# Patient Record
Sex: Male | Born: 2000
Health system: Southern US, Community
[De-identification: ages and names within clinical notes are randomized; demographics above are authoritative.]

## PROBLEM LIST (undated history)

## (undated) DIAGNOSIS — J45909 Unspecified asthma, uncomplicated: Secondary | ICD-10-CM

## (undated) HISTORY — PX: TESTICLE TORSION REDUCTION: SHX795

## (undated) HISTORY — DX: Unspecified asthma, uncomplicated: J45.909

---
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
## 2019-01-03 DIAGNOSIS — F332 Major depressive disorder, recurrent severe without psychotic features: Secondary | ICD-10-CM | POA: Diagnosis not present

## 2019-02-03 MED FILL — clonazePAM 1 MG TABS: 1 | 30 days supply | Qty: 30 | Fill #0

## 2019-02-03 MED FILL — VENLAFAXINE HCL ER 37.5 MG: 37.5 | 30 days supply | Qty: 30 | Fill #0

## 2019-03-31 ENCOUNTER — Other Ambulatory Visit: Payer: Self-pay

## 2019-03-31 ENCOUNTER — Ambulatory Visit: Payer: 59 | Admitting: Psychiatry

## 2019-03-31 ENCOUNTER — Encounter: Payer: Self-pay | Admitting: Psychiatry

## 2019-03-31 VITALS — Ht 69.0 in | Wt 223.0 lb

## 2019-03-31 DIAGNOSIS — F341 Dysthymic disorder: Secondary | ICD-10-CM

## 2019-03-31 DIAGNOSIS — F411 Generalized anxiety disorder: Secondary | ICD-10-CM

## 2019-03-31 DIAGNOSIS — F422 Mixed obsessional thoughts and acts: Secondary | ICD-10-CM | POA: Diagnosis not present

## 2019-03-31 MED ORDER — FLUVOXAMINE MALEATE 100 MG PO TABS: 100.0000 mg | ORAL_TABLET | Freq: Every day | ORAL | 2 refills | Status: DC

## 2019-03-31 MED FILL — FLUVOXAMINE MALEATE 100 MG: 100 | 30 days supply | Qty: 30 | Fill #0
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
## 2019-04-13 DIAGNOSIS — H5213 Myopia, bilateral: Secondary | ICD-10-CM | POA: Diagnosis not present

## 2019-06-03 ENCOUNTER — Ambulatory Visit: Payer: 59 | Admitting: Psychiatry

## 2019-06-03 ENCOUNTER — Other Ambulatory Visit: Payer: Self-pay

## 2019-06-03 ENCOUNTER — Ambulatory Visit (INDEPENDENT_AMBULATORY_CARE_PROVIDER_SITE_OTHER): Payer: 59 | Admitting: Psychiatry

## 2019-06-03 ENCOUNTER — Encounter: Payer: Self-pay | Admitting: Psychiatry

## 2019-06-03 VITALS — Ht 69.0 in | Wt 217.0 lb

## 2019-06-03 DIAGNOSIS — F341 Dysthymic disorder: Secondary | ICD-10-CM

## 2019-06-03 DIAGNOSIS — F411 Generalized anxiety disorder: Secondary | ICD-10-CM | POA: Diagnosis not present

## 2019-06-03 DIAGNOSIS — F422 Mixed obsessional thoughts and acts: Secondary | ICD-10-CM
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
## 2019-08-31 DIAGNOSIS — J310 Chronic rhinitis: Secondary | ICD-10-CM | POA: Diagnosis not present

## 2019-08-31 DIAGNOSIS — R21 Rash and other nonspecific skin eruption: Secondary | ICD-10-CM | POA: Diagnosis not present

## 2019-08-31 DIAGNOSIS — J4530 Mild persistent asthma, uncomplicated: Secondary | ICD-10-CM | POA: Diagnosis not present

## 2019-08-31 MED FILL — QVAR REDIHALER 80 MCG/ACT A: 80 | 30 days supply | Qty: 11 | Fill #0

## 2019-08-31 MED FILL — FLUTICASONE PROP 50 MCG SPR: 50 | 60 days supply | Qty: 16 | Fill #0

## 2019-08-31 MED FILL — ALBUTEROL SULFATE HFA 108 (: 108 (90 BAS | 25 days supply | Qty: 18 | Fill #0

## 2019-09-29 ENCOUNTER — Other Ambulatory Visit: Payer: Self-pay

## 2019-09-29 ENCOUNTER — Ambulatory Visit (INDEPENDENT_AMBULATORY_CARE_PROVIDER_SITE_OTHER): Payer: 59 | Admitting: Adult Health

## 2019-09-29 ENCOUNTER — Encounter: Payer: Self-pay | Admitting: Adult Health

## 2019-09-29 VITALS — BP 138/76 | HR 95 | Temp 99.4°F | Ht 70.25 in | Wt 208.6 lb

## 2019-09-29 DIAGNOSIS — Z114 Encounter for screening for human immunodeficiency virus [HIV]: Secondary | ICD-10-CM

## 2019-09-29 DIAGNOSIS — Z7253 High risk bisexual behavior: Secondary | ICD-10-CM | POA: Diagnosis not present

## 2019-09-29 DIAGNOSIS — Z23 Encounter for immunization: Secondary | ICD-10-CM | POA: Diagnosis not present

## 2019-09-29 DIAGNOSIS — Z0000 Encounter for general adult medical examination without abnormal findings: Secondary | ICD-10-CM | POA: Diagnosis not present

## 2019-09-29 DIAGNOSIS — F341 Dysthymic disorder: Secondary | ICD-10-CM

## 2019-09-29 DIAGNOSIS — F411 Generalized anxiety disorder: Secondary | ICD-10-CM

## 2019-09-29 NOTE — Progress Notes (Signed)
Subjective:    Patient ID: David Sweeney, male    DOB: [birth year], 18 y.o.   MRN: [id number]  HPI:  David Sweeney is here to establish as a new pt. He is a pleasant 18 year old male. PMH: Asthma, Musculoskeletal pain, GAD, Mixed obsessional thoughts/acts, MDD- followed by Psychiatry/Psychology  Previously on Venlafaxine and Clonazepam- not currently on any psychotropics  He reports thoughts of harming himself last week, however denies suicidal plans and made verbal contract to seek immediate medical care if he has any serious suicidal ideations. He lives at home with parents and two younger siblings. He reports mother and sister both have mental health disorders- GAD, depression. He is concerned about Bipolar D/O, encouraged to discuss with Psychiatry. He reports depression/GAD >3 years- has [facility] care team- psychiatry and psychology-  Last OV with Psychiatry 06/03/2019, f/u PRN Last OV with Psychology 04/13/2019, however cannot read note- system indicates "Encounter for Claims". He denies tobacco/vape/ETOH/ilicit drug use. He lost his job in March due to pandemic lay offs. He has not been in school for [employer] at [employer]. He plans on resuming studies Jan 2021. He reports sleeping 5-7 hrs/night, drinking 2-4 cups coffee/day. He reports intermittent R hip and lumbar back pain- chronic in nature but feels that both have worsened >8 months since he has been more sedentary with loss of job/school. He previously enjoyed yoga and jogging- has not engaged in either activity >6 months.  Depression screen PHQ 2/9 09/29/2019  Decreased Interest 2  Down, Depressed, Hopeless 1  PHQ - 2 Score 3  Altered sleeping 3  Tired, decreased energy 1  Change in appetite 2  Feeling bad or failure about yourself  3  Trouble concentrating 0  Moving slowly or fidgety/restless 3  Suicidal thoughts 1  PHQ-9 Score 16  Difficult doing work/chores Somewhat difficult   Patient Care Team     Relationship Specialty Notifications Start End  Patient, No Pcp Per PCP - General General Practice  09/18/18     Patient Active Problem List   Diagnosis Date Noted  . Healthcare maintenance 09/29/2019  . Persistent depressive disorder with mixed features, currently mild 09/18/2018  . GAD (generalized anxiety disorder) 08/31/2018  . Mixed obsessional thoughts and acts 08/31/2018  . Episodic tension-type headache, not intractable 09/04/2017     Past Medical History:  Diagnosis Date  . Asthma      Past Surgical History:  Procedure Laterality Date  . TESTICLE TORSION REDUCTION       Family History  Problem Relation Age of Onset  . Hypertension Father   . Depression Sister   . Cancer Maternal Grandmother        colon, appendix  . Depression Maternal Grandmother   . Hypertension Maternal Grandmother   . Diabetes Paternal Grandfather   . Depression Maternal Aunt   . Alcohol abuse Paternal Aunt   . Depression Paternal Aunt      Social History   Substance and Sexual Activity  Drug Use Never     Social History   Substance and Sexual Activity  Alcohol Use Not Currently     Social History   Tobacco Use  Smoking Status Never Smoker  Smokeless Tobacco Never Used     Outpatient Encounter Medications as of 09/29/2019  Medication Sig  . fluticasone (FLONASE) 50 MCG/ACT nasal spray Place 1 spray into both nostrils daily.  Marland Kitchen QVAR REDIHALER 40 MCG/ACT inhaler   . [DISCONTINUED] amoxicillin (AMOXIL) 875 MG tablet Take 1  tablet (875 mg total) by mouth 2 (two) times daily.  . [DISCONTINUED] montelukast (SINGULAIR) 10 MG tablet Take 10 mg daily by mouth.   No facility-administered encounter medications on file as of 09/29/2019.     Allergies: Patient has no known allergies.  Body mass index is 29.72 kg/m.  Blood pressure 138/76, pulse 95, temperature 99.4 F (37.4 C), temperature source Oral, height 5' 10.25" (1.784 m), weight 208 lb 9.6 oz (94.6 kg), SpO2 100  %.  Review of Systems  Constitutional: Positive for fatigue. Negative for activity change, appetite change, chills, diaphoresis, fever and unexpected weight change.  Eyes: Negative for visual disturbance.  Respiratory: Negative for cough, chest tightness, shortness of breath, wheezing and stridor.   Cardiovascular: Negative for chest pain, palpitations and leg swelling.  Neurological: Negative for dizziness and headaches.  Hematological: Negative for adenopathy. Does not bruise/bleed easily.  Psychiatric/Behavioral: Positive for dysphoric mood. Negative for agitation, behavioral problems, confusion, decreased concentration, hallucinations, self-injury, sleep disturbance and suicidal ideas. The patient is nervous/anxious. The patient is not hyperactive.        Objective:   Physical Exam Constitutional:      General: He is not in acute distress.    Appearance: Normal appearance. He is obese. He is not ill-appearing, toxic-appearing or diaphoretic.  HENT:     Head: Normocephalic and atraumatic.  Eyes:     Extraocular Movements: Extraocular movements intact.     Conjunctiva/sclera: Conjunctivae normal.     Pupils: Pupils are equal, round, and reactive to light.  Cardiovascular:     Rate and Rhythm: Normal rate and regular rhythm.     Pulses: Normal pulses.     Heart sounds: Normal heart sounds. No murmur. No friction rub. No gallop.   Skin:    General: Skin is warm and dry.     Capillary Refill: Capillary refill takes less than 2 seconds.  Neurological:     Mental Status: He is alert and oriented to person, place, and time.  Psychiatric:        Attention and Perception: Attention and perception normal.        Mood and Affect: Mood is anxious.        Speech: Speech normal.        Behavior: Behavior normal.        Thought Content: Thought content normal.        Cognition and Memory: Cognition normal.        Judgment: Judgment normal.     Comments: Pt was reluctant to make/hold eye  contact        Assessment & Plan:   1. Screening for HIV (human immunodeficiency virus)   2. High risk bisexual behavior   3. Need for influenza vaccination   4. Healthcare maintenance   5. GAD (generalized anxiety disorder)   6. Persistent depressive disorder with mixed features, currently mild     Healthcare maintenance 1) Please call your psychiatrist and psychologist to schedule appt's in the next 1-2 weeks. If you have serious thoughts of harming yourself/others- seek immediate medical care. 2) Increase daily exercise- recommend yoga , walking . 3) if back pain does not improve in 6 weeks, please call clinic- will refer to Physical Therapy. 4) Try to eat at least three meals/day, continue with your excellent water intake. 5) Continue with your Pulmonologist as directed. 6) Follow-up 2 months for complete physical, we will contact you with your lab results. 7) Continue to social distance and wear a mask when  in public.  GAD (generalized anxiety disorder) Please call your psychiatrist and psychologist to schedule appt's in the next 1-2 weeks. If you have serious thoughts of harming yourself/others- seek immediate medical care. Pt verbalized understanding/agreement  Persistent depressive disorder with mixed features, currently mild Depression screen PHQ 2/9 09/29/2019  Decreased Interest 2  Down, Depressed, Hopeless 1  PHQ - 2 Score 3  Altered sleeping 3  Tired, decreased energy 1  Change in appetite 2  Feeling bad or failure about yourself  3  Trouble concentrating 0  Moving slowly or fidgety/restless 3  Suicidal thoughts 1  PHQ-9 Score 16  Difficult doing work/chores Somewhat difficult  Please call your psychiatrist and psychologist to schedule appt's in the next 1-2 weeks. If you have serious thoughts of harming yourself/others- seek immediate medical care. Pt verbalized understanding/agreement    FOLLOW-UP:  Return in about 2 months (around 11/30/2019)  for CPE.

## 2019-09-29 NOTE — Assessment & Plan Note (Signed)
1) Please call your psychiatrist and psychologist to schedule appt's in the next 1-2 weeks. If you have serious thoughts of harming yourself/others- seek immediate medical care. 2) Increase daily exercise- recommend yoga 38mins, walking 96mins. 3) if back pain does not improve in 6 weeks, please call clinic- will refer to Physical Therapy. 4) Try to eat at least three meals/day, continue with your excellent water intake. 5) Continue with your Pulmonologist as directed. 6) Follow-up 2 months for complete physical, we will contact you with your lab results. 7) Continue to social distance and wear a mask when in public.

## 2019-09-29 NOTE — Patient Instructions (Addendum)
Chronic Back Pain When back pain lasts longer than 3 months, it is called chronic back pain.The cause of your back pain may not be known. Some common causes include:  Wear and tear (degenerative disease) of the bones, ligaments, or disks in your back.  Inflammation and stiffness in your back (arthritis). People who have chronic back pain often go through certain periods in which the pain is more intense (flare-ups). Many people can learn to manage the pain with home care. Follow these instructions at home: Pay attention to any changes in your symptoms. Take these actions to help with your pain: Activity   Avoid bending and other activities that make the problem worse.  Maintain a proper position when standing or sitting: ? When standing, keep your upper back and neck straight, with your shoulders pulled back. Avoid slouching. ? When sitting, keep your back straight and relax your shoulders. Do not round your shoulders or pull them backward.  Do not sit or stand in one place for long periods of time.  Take brief periods of rest throughout the day. This will reduce your pain. Resting in a lying or standing position is usually better than sitting to rest.  When you are resting for longer periods, mix in some mild activity or stretching between periods of rest. This will help to prevent stiffness and pain.  Get regular exercise. Ask your health care provider what activities are safe for you.  Do not lift anything that is heavier than 10 lb (4.5 kg). Always use proper lifting technique, which includes: ? Bending your knees. ? Keeping the load close to your body. ? Avoiding twisting.  Sleep on a firm mattress in a comfortable position. Try lying on your side with your knees slightly bent. If you lie on your back, put a pillow under your knees. Managing pain  If directed, apply ice to the painful area. Your health care provider may recommend applying ice during the first 24-48 hours after  a flare-up begins. ? Put ice in a plastic bag. ? Place a towel between your skin and the bag. ? Leave the ice on for 20 minutes, 2-3 times per day.  If directed, apply heat to the affected area as often as told by your health care provider. Use the heat source that your health care provider recommends, such as a moist heat pack or a heating pad. ? Place a towel between your skin and the heat source. ? Leave the heat on for 20-30 minutes. ? Remove the heat if your skin turns bright red. This is especially important if you are unable to feel pain, heat, or cold. You may have a greater risk of getting burned.  Try soaking in a warm tub.  Take over-the-counter and prescription medicines only as told by your health care provider.  Keep all follow-up visits as told by your health care provider. This is important. Contact a health care provider if:  You have pain that is not relieved with rest or medicine. Get help right away if:  You have weakness or numbness in one or both of your legs or feet.  You have trouble controlling your bladder or your bowels.  You have nausea or vomiting.  You have pain in your abdomen.  You have shortness of breath or you faint. This information is not intended to replace advice given to you by your health care provider. Make sure you discuss any questions you have with your health care provider. Document Released: 11/22/2004   Document Revised: 02/05/2019 Document Reviewed: 04/24/2017 Elsevier Patient Education  [place].  1) Please call your psychiatrist and psychologist to schedule appt's in the next 1-2 weeks. If you have serious thoughts of harming yourself/others- seek immediate medical care. 2) Increase daily exercise- recommend yoga 88mins, walking 36mins. 3) if back pain does not improve in 6 weeks, please call clinic- will refer to Physical Therapy. 4) Try to eat at least three meals/day, continue with your excellent water intake. 5)  Continue with your Pulmonologist as directed. 6) Follow-up 2 months for complete physical, we will contact you with your lab results. 7) Continue to social distance and wear a mask when in public. WELCOME TO THE PRACTICE!

## 2019-09-29 NOTE — Assessment & Plan Note (Addendum)
Depression screen PHQ 2/9 09/29/2019  Decreased Interest 2  Down, Depressed, Hopeless 1  PHQ - 2 Score 3  Altered sleeping 3  Tired, decreased energy 1  Change in appetite 2  Feeling bad or failure about yourself  3  Trouble concentrating 0  Moving slowly or fidgety/restless 3  Suicidal thoughts 1  PHQ-9 Score 16  Difficult doing work/chores Somewhat difficult  Please call your psychiatrist and psychologist to schedule appt's in the next 1-2 weeks. If you have serious thoughts of harming yourself/others- seek immediate medical care. Pt verbalized understanding/agreement

## 2019-09-29 NOTE — Assessment & Plan Note (Addendum)
Please call your psychiatrist and psychologist to schedule appt's in the next 1-2 weeks. If you have serious thoughts of harming yourself/others- seek immediate medical care. Pt verbalized understanding/agreement

## 2019-09-30 LAB — HIV ANTIBODY (ROUTINE TESTING W REFLEX): HIV Screen 4th Generation wRfx: NONREACTIVE

## 2019-10-13 ENCOUNTER — Encounter: Payer: Self-pay | Admitting: Adult Health

## 2019-10-13 ENCOUNTER — Other Ambulatory Visit: Payer: Self-pay

## 2019-10-13 ENCOUNTER — Ambulatory Visit (INDEPENDENT_AMBULATORY_CARE_PROVIDER_SITE_OTHER): Payer: 59 | Admitting: Adult Health

## 2019-10-13 VITALS — BP 116/69 | HR 73 | Temp 99.3°F | Ht 70.25 in | Wt 212.1 lb

## 2019-10-13 DIAGNOSIS — G8929 Other chronic pain: Secondary | ICD-10-CM | POA: Diagnosis not present

## 2019-10-13 DIAGNOSIS — F411 Generalized anxiety disorder: Secondary | ICD-10-CM

## 2019-10-13 DIAGNOSIS — F341 Dysthymic disorder: Secondary | ICD-10-CM

## 2019-10-13 DIAGNOSIS — M25551 Pain in right hip: Secondary | ICD-10-CM | POA: Diagnosis not present

## 2019-10-13 DIAGNOSIS — Z0000 Encounter for general adult medical examination without abnormal findings: Secondary | ICD-10-CM

## 2019-10-13 NOTE — Assessment & Plan Note (Signed)
Pain steadily worsening since 2016. Referral to Orthopedic Specialist placed.
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
## 2019-10-13 NOTE — Patient Instructions (Addendum)

## 2019-10-13 NOTE — Assessment & Plan Note (Signed)
Not currently on RX Advised to f/u with his mental health care team

## 2019-10-13 NOTE — Assessment & Plan Note (Signed)
Remain well hydrated, follow heart healthy diet. Do not perform high impact activities until evaluated by Orthopedic Specialist- referral placed, re: Chronic right hip pain. Recommend that you follow-up with your Mental Health care team. Continue to social distance and wear a mask when in public. Continue with your pulmonologist as directed. Recommend annual physical.

## 2019-10-13 NOTE — Assessment & Plan Note (Signed)
Mood stable, he reports feeling "less depressed" currently. He denies SI/HI

## 2019-10-14 ENCOUNTER — Ambulatory Visit: Payer: 59 | Admitting: Orthopaedic Surgery

## 2019-10-14 ENCOUNTER — Ambulatory Visit (INDEPENDENT_AMBULATORY_CARE_PROVIDER_SITE_OTHER): Payer: 59

## 2019-10-14 ENCOUNTER — Encounter: Payer: Self-pay | Admitting: Orthopaedic Surgery

## 2019-10-14 DIAGNOSIS — M25551 Pain in right hip: Secondary | ICD-10-CM

## 2019-10-14 DIAGNOSIS — M545 Low back pain, unspecified: Secondary | ICD-10-CM

## 2019-10-14 NOTE — Progress Notes (Signed)
Office Visit Note   Patient: David Sweeney           Date of Birth: [birth year]           MRN: [id number] Visit Date: 10/14/2019              Requested by: Julaine Fusi, NP [address] [place],  [place] [address] PCP: Julaine Fusi, NP   Assessment & Plan: Visit Diagnoses:  1. Pain in right hip   2. Right-sided low back pain without sciatica, unspecified chronicity     Plan: My impression is low back pain and right hip pain. His symptoms are more related to the back therefore I recommended over-the-counter NSAIDs and outpatient physical therapy.  Follow-Up Instructions: Return if symptoms worsen or fail to improve.   Orders:  Orders Placed This Encounter  Procedures  . XR HIP UNILAT W OR W/O PELVIS 2-3 VIEWS RIGHT  . XR Lumbar Spine 2-3 Views  . Ambulatory referral to Physical Therapy   No orders of the defined types were placed in this encounter.     Procedures: No procedures performed   Clinical Data: No additional findings.   Subjective: Chief Complaint  Patient presents with  . Right Hip - Pain    Patient is a 18 year old comes in for evaluation of right hip and low back pain for years per the patient.  Denies any injuries.  He states that he feels a popping in his hip and he has low back pain.  Denies any radicular symptoms.  Denies any groin pain.  The pain is worse with walking and ambulation.   Review of Systems  Constitutional: Negative.   All other systems reviewed and are negative.    Objective: Vital Signs: There were no vitals taken for this visit.  Physical Exam Vitals and nursing note reviewed.  Constitutional:      Appearance: He is well-developed.  HENT:     Head: Normocephalic and atraumatic.  Eyes:     Pupils: Pupils are equal, round, and reactive to light.  Pulmonary:     Effort: Pulmonary effort is normal.  Abdominal:     Palpations: Abdomen is soft.  Musculoskeletal:        General: Normal range of motion.   Cervical back: Neck supple.  Skin:    General: Skin is warm.  Neurological:     Mental Status: He is alert and oriented to person, place, and time.  Psychiatric:        Behavior: Behavior normal.        Thought Content: Thought content normal.        Judgment: Judgment normal.     Ortho Exam Right hip exam shows full range of motion that is painless.  No sciatic tension signs.  Negative Faber.  Negative FADIR. Low back exam is tender to palpation along the spinous processes and along the right side of the lumbar musculature. Specialty Comments:  No specialty comments available.  Imaging: XR HIP UNILAT W OR W/O PELVIS 2-3 VIEWS RIGHT  Result Date: 10/14/2019 No acute or structural abnormalities  XR Lumbar Spine 2-3 Views  Result Date: 10/14/2019 No acute or structural abnormalities.    PMFS History: Patient Active Problem List   Diagnosis Date Noted  . Chronic hip pain, right 10/13/2019  . Healthcare maintenance 09/29/2019  . Persistent depressive disorder with mixed features, currently mild 09/18/2018  . GAD (generalized anxiety disorder) 08/31/2018  . Mixed obsessional thoughts and acts 08/31/2018  .  Episodic tension-type headache, not intractable 09/04/2017   Past Medical History:  Diagnosis Date  . Asthma     Family History  Problem Relation Age of Onset  . Hypertension Father   . Depression Sister   . Cancer Maternal Grandmother        colon, appendix  . Depression Maternal Grandmother   . Hypertension Maternal Grandmother   . Diabetes Paternal Grandfather   . Depression Maternal Aunt   . Alcohol abuse Paternal Aunt   . Depression Paternal Aunt     Past Surgical History:  Procedure Laterality Date  . TESTICLE TORSION REDUCTION     Social History   Occupational History  . Not on file  Tobacco Use  . Smoking status: Never Smoker  . Smokeless tobacco: Never Used  Substance and Sexual Activity  . Alcohol use: Not Currently  . Drug use: Never  .  Sexual activity: Not Currently

## 2019-10-21 MED FILL — QVAR REDIHALER 80 MCG/ACT A: 80 | 30 days supply | Qty: 11 | Fill #1

## 2019-11-10 MED FILL — AMOXICILLIN 500 MG CAPSULE: 500 | 1 days supply | Qty: 4 | Fill #0

## 2019-11-16 ENCOUNTER — Ambulatory Visit: Payer: 59 | Attending: Internal Medicine

## 2019-11-16 DIAGNOSIS — Z20822 Contact with and (suspected) exposure to covid-19: Secondary | ICD-10-CM | POA: Diagnosis not present

## 2019-11-17 LAB — NOVEL CORONAVIRUS, NAA: SARS-CoV-2, NAA: NOT DETECTED

## 2019-11-18 ENCOUNTER — Ambulatory Visit: Payer: 59 | Admitting: Physical Therapy

## 2019-11-19 MED FILL — AMOXICILLIN 500 MG CAPSULE: 500 | 1 days supply | Qty: 4 | Fill #0

## 2019-12-03 ENCOUNTER — Ambulatory Visit (INDEPENDENT_AMBULATORY_CARE_PROVIDER_SITE_OTHER): Payer: 59 | Admitting: Psychiatry

## 2019-12-03 ENCOUNTER — Encounter: Payer: Self-pay | Admitting: Psychiatry

## 2019-12-03 ENCOUNTER — Other Ambulatory Visit: Payer: Self-pay

## 2019-12-03 VITALS — Ht 70.0 in | Wt 212.0 lb

## 2019-12-03 DIAGNOSIS — F341 Dysthymic disorder: Secondary | ICD-10-CM | POA: Diagnosis not present

## 2019-12-03 DIAGNOSIS — F422 Mixed obsessional thoughts and acts: Secondary | ICD-10-CM | POA: Diagnosis not present

## 2019-12-03 DIAGNOSIS — F411 Generalized anxiety disorder: Secondary | ICD-10-CM | POA: Diagnosis not present

## 2019-12-03 MED ORDER — CLOMIPRAMINE HCL 75 MG PO CAPS: 75.0000 mg | ORAL_CAPSULE | Freq: Every day | ORAL | 2 refills | Status: DC

## 2019-12-03 MED FILL — clomiPRAMINE HCL 75 MG CAPS: 75 | 30 days supply | Qty: 30 | Fill #0

## 2019-12-03 NOTE — Progress Notes (Signed)
[facility]  Patient ID: David Sweeney,  MRN: [id number]  PCP: Esaw Grandchild, NP  Date of Evaluation: 12/03/2019 Time spent:20 minutes from 1600 to 1620 Chief Complaint:  Chief Complaint    Anxiety; Stress; Depression      HISTORY/CURRENT STATUS: Q is seen onsite in office 20 minutes face-to-face individually with consent with epic collateral for psychiatric interview and exam in 74-month evaluation and management of marginal insight OCD, generalized anxiety, and dysthymia with mixed features though cluster A character defenses render therapeutic change difficult to secure. However after last appointment in which patient had continued dissatisfaction with medications including Luvox and Klonopin following Effexor, Lexapro and Zoloft, the patient preferred to terminate treatment unless needed later and considered seeing psychologist for therapy or further opinion.  He returns stating he cannot get an appointment at [facility] psychology as he planned last session specifically with Dr. Gaynell Face.  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  His current complaints are guilt about taking advantage of parents supporting him, anger, anxiety, insomnia, and fixation in these symptom postures as though unable to help himself or be helped by others.  He required speech therapy and occupational therapy for hand strength and coordination in elementary and preschool years.  Anxiety has been progressive since middle school having IEP determining need for treatment then delayed by patient.  Procrastination and underachievement continued through high school having anxiety similar to mother and sister. Mother did poorly on Lexapro or Zoloft but is doing best on Cymbalta for panic and  hypnagogic hallucinations. Younger brother did poorly on methylphenidate for ADHD. Sisterr is doing well on Lexapro.  The patient seeks treatment now seemingly as though he must improve before he will further pursue his previous plans to request testing such as for bipolar disorder with no previous significant concern for autism or psychotic disorder.  He has no mania, suicidality, psychosis, or delirium currently.   Depression        The patient presents withhistory of depression as a chronicproblem starting more than 2 year ago. The onset quality was gradual. The problem occurred every several days.The problem has been waxing and waning.Associated symptoms include decreased concentration,fatigue, anger, irritability, anxious/nervous behavior, sense of worry, doubt projecting failure, compulsive rituals such as handwashing, obsessional thoughts,ruminative guilt, episodic insomnia, variable dysphoric dissatisfaction, and helplessness. Associated symptoms include no hopelessness,no decreased interest,no headaches,no perceptions,and no suicidal ideas.The symptoms are aggravated by work stress, social issues and family issues.Past treatments include SNRIs - Serotonin and norepinephrine reuptake inhibitors, SSRIs - Selective serotonin reuptake inhibitors and other medications.Compliance with treatment is variable.Past compliance problems include difficulty with treatment plan and medication issues.Previous treatment provided moderaterelief.Risk factors include a change in medication usage/dosage, family history, family history of mental illness, history of mental illness, major life event, prior traumatic experience, and stress. Past medical history includes anxiety,depression,mental health disorderand obsessive-compulsive disorder. Pertinent negatives include no chronic illness,no recent illness,no life-threatening condition,no physical disability,no bipolar  disorder,no eating disorder,no post-traumatic stress disorder,no schizophrenia,no suicide attemptsand no head trauma.  Individual Medical History/ Review of Systems: Changes? :Yes Weight was 153 pounds when initially seen in 10th grade becoming an adult weight of 225 pounds currently reduced over the last 56months to 212 pounds.  Low back and right hip pain has  been seen by orthopedist, he has not attended pulmonology for asthma, he has been tested for STD, and he has a history of testicular torsion surgery.  Allergies: Patient has no known allergies.  Current Medications:  Current Outpatient Medications:  .  clomiPRAMINE (ANAFRANIL) 75 MG capsule, Take 1 capsule (75 mg total) by mouth at bedtime., Disp: 30 capsule, Rfl: 2 .  fluticasone (FLONASE) 50 MCG/ACT nasal spray, Place 1 spray into both nostrils daily., Disp: , Rfl:  .  QVAR REDIHALER 40 MCG/ACT inhaler, , Disp: , Rfl:    Medication Side Effects: none  Family Medical/ Social History: Changes? No as sister and mother are currently doing well in treatment but patient has regressed after improvement completing high school and working and starting study at [employer] in a [employer] apprenticeship program from which he was terminated for unknown reasons.  MENTAL HEALTH EXAM:  Height 5\' 10"  (1.778 m), weight 212 lb (96.2 kg).Body mass index is 30.42 kg/m. Muscle strengths and tone 5/5, postural reflexes and gait 0/0, and AIMS = 0 otherwise deferred for coronavirus shutdown  General Appearance: Casual, Fairly Groomed, Guarded, Meticulous and Obese  Eye Contact:  Fair to limited  Speech:  Blocked, Clear and Coherent, Garbled and Slow  Volume:  Decreased  Mood:  Anxious, Depressed, Dysphoric, Irritable and Worthless  Affect:  Depressed, Inappropriate, Restricted and Anxious  Thought Process:  Coherent, Irrelevant and Descriptions of Associations: Tangential and Circumstantial  Orientation:  Full (Time, Place, and Person)  Thought Content:  Obsessions, Paranoid Ideation, Rumination and Tangential   Suicidal Thoughts:  No but passive ideation endorsed on PHQ-9 on 09/29/2019  Homicidal Thoughts:  No  Memory:  Immediate;   Good Remote;   Fair  Judgement:  Impaired  Insight:  Fair and Lacking  Psychomotor Activity:  Decreased and Mannerisms  Concentration:  Concentration: Fair and Attention Span: Good  Recall:  Good  Fund of Knowledge: Good  Language: Fair  Assets:  Leisure Time Resilience Talents/Skills  ADL's:  Intact  Cognition: WNL  Prognosis:  Fair    DIAGNOSES:    ICD-10-CM   1. Mixed obsessional thoughts and acts  F42.2 clomiPRAMINE (ANAFRANIL) 75 MG capsule  2. Persistent depressive disorder with mixed features, currently mild  F34.1 clomiPRAMINE (ANAFRANIL) 75 MG capsule  3. GAD (generalized anxiety disorder)  F41.1 clomiPRAMINE (ANAFRANIL) 75 MG capsule  Screening: Depression screen [facility] 2/9 09/29/2019  Decreased Interest 2  Down, Depressed, Hopeless 1  PHQ - 2 Score 3  Altered sleeping 3  Tired, decreased energy 1  Change in appetite 2  Feeling bad or failure about yourself  3  Trouble concentrating 0  Moving slowly or fidgety/restless 3  Suicidal thoughts 1  PHQ-9 Score 16  Difficult doing work/chores Somewhat difficult  By 14/10/2018, NP at sexual health appointment regarding high risk bisexual behavior   Receiving Psychotherapy: No    RECOMMENDATIONS: Clinical course of previous care has been most successful by treating anxiety foremost except psychotherapy has not been successful interpersonally.  Through the course of middle school onward educational behavioral interventions, patient has avoided treatment predominantly after initial success in preschool and elementary.  He requests reevaluation without being specific having a stent whether he had bipolar disorder in earlier appointments this year.  OCD remains clinically most primary with generalized anxiety and dysthymia the other treatment  targets.  Treatment is likely to require antiobsessional antianxiety antidepressant medication likely augmented by low-dose neuroleptic.  He does not at this time agree to more frequent  follow-up sessions than 3 months such that we consolidate initial treatment to Anafranil and therapy urging him to return sooner for follow-up if willing to be more rigorous with medication interventions.  He is E scribed Anafranil 75 mg every bedtime sent as #30 with 2 refills to [facility] outpatient pharmacy for OCD, GAD, dysthymia with mixed features.  He may phone for medication dosing adjustment or augmentation with Trilafon in the interim not returning sooner.  Psychotherapy is recommended with Rollen Sox, LPC at [facility] as he seeks alternative for his lack of appointing to [facility].  Prevention and monitoring and safety hygiene are addressed with crisis plans if needed, mother not attending today as though requiring patient to become more mature and committed in his treatment, though mother has monitored treatment needs in the past.  Chauncey Mann, MD

## 2019-12-07 ENCOUNTER — Encounter: Payer: Self-pay | Admitting: Physical Therapy

## 2019-12-07 ENCOUNTER — Other Ambulatory Visit: Payer: Self-pay

## 2019-12-07 ENCOUNTER — Ambulatory Visit (INDEPENDENT_AMBULATORY_CARE_PROVIDER_SITE_OTHER): Payer: 59 | Admitting: Physical Therapy

## 2019-12-07 DIAGNOSIS — M25551 Pain in right hip: Secondary | ICD-10-CM | POA: Diagnosis not present

## 2019-12-07 DIAGNOSIS — G8929 Other chronic pain: Secondary | ICD-10-CM

## 2019-12-07 DIAGNOSIS — M545 Low back pain: Secondary | ICD-10-CM | POA: Diagnosis not present

## 2019-12-07 MED FILL — QVAR REDIHALER 80 MCG/ACT A: 80 | 30 days supply | Qty: 11 | Fill #2

## 2019-12-07 NOTE — Patient Instructions (Signed)
Access Code: [email or web address]  URL: [email or web address]  Date: 12/07/2019  Prepared by: Ivery Quale   Exercises  Standing Lumbar Extension - 10 reps - 1-2 sets - 5 hold - 2x daily - 6x weekly  Standing Quadratus Lumborum Stretch with Doorway - 2-3 reps - 1 sets - 30 hold - 2x daily - 6x weekly  Sidelying Thoracic Rotation with Open Book - 10 reps - 1 sets - 5-10 sec hold - 2x daily - 6x weekly  Supine Piriformis Stretch - 3 reps - 1 sets - 30 hold - 2x daily - 6x weekly  Supine Hamstring Stretch with Strap - 3 reps - 1 sets - 30 hold - 2x daily - 6x weekly  Supine Bridge - 10 reps - 1-2 sets - 5 hold - 2x daily - 6x weekly  Bird Dog - 10 reps - 1 sets - 5 sec hold - 2x daily - 6x weekly  Standard Plank - 3 reps - 1 sets - 20-30 sec hold - 2x daily - 6x weekly  Side Plank on Elbow - 2-3 reps - 1 sets - 20-30 sec hold - 2x daily - 6x weekly  Patient Education  TENS Therapy

## 2019-12-07 NOTE — Therapy (Signed)
[facility] [address] [place], [place], [address] Phone: [phone]   Fax:  [phone]  Physical Therapy Evaluation  Patient Details  Name: David Sweeney MRN: [id number] Date of Birth: 05-Jan-2001 Referring Provider (PT): Haynes Kerns Date: 12/07/2019  PT End of Session - 12/07/19 1950    Visit Number  1    Number of Visits  6    Date for PT Re-Evaluation  01/18/20    PT Start Time  1450    PT Stop Time  1530    PT Time Calculation (min)  40 min    Activity Tolerance  Patient tolerated treatment well    Behavior During Therapy  [facility] for tasks assessed/performed       Past Medical History:  Diagnosis Date  . Asthma     Past Surgical History:  Procedure Laterality Date  . TESTICLE TORSION REDUCTION      There were no vitals filed for this visit.   Subjective Assessment - 12/07/19 1454    Subjective  He relays mid-low back pain and Rt hip pain for a year per the patient.  Denies any injuries.  He states that he feels a popping in his hip that feels deep in the ball joint but only does this on occasion. He is not sure what causes it. Denies any radicular symptoms. He has some pain with lifting or jogging but generally no pain with standing, walking, or stairs.He does relay he is mostly sendentary.    Pertinent History  no significant PMH    Limitations  Lifting    How long can you stand comfortably?  not limited    How long can you walk comfortably?  not limited    Diagnostic tests  negative XR for hip and back    Currently in Pain?  Yes    Pain Score  6     Pain Location  Back   and Rt hip   Pain Orientation  Right    Pain Descriptors / Indicators  Aching;Burning;Sharp    Pain Type  Chronic pain    Pain Radiating Towards  Rt hip but not down to his knee    Pain Onset  More than a month ago    Pain Frequency  Intermittent    Aggravating Factors   lifting, jogging    Pain Relieving Factors  yoga, has not tried heat or ice         [facility] PT Assessment - 12/07/19 0001      Assessment   Medical Diagnosis  chronic LBP, Rt hip pain    Referring Provider (PT)  Xu    Onset Date/Surgical Date  --   one year onset of pain   Next MD Visit  nothing scheduled    Prior Therapy  none      Precautions   Precautions  None      Balance Screen   Has the patient fallen in the past 6 months  No      Home Environment   Living Environment  Private residence      Prior Function   Level of Independence  Independent    Vocation  Unemployed    Leisure  computers, programming      Cognition   Overall Cognitive Status  Within Functional Limits for tasks assessed      Sensation   Light Touch  Appears Intact      Posture/Postural Control   Posture Comments  WNL  ROM / Strength   AROM / PROM / Strength  AROM;Strength      AROM   Overall AROM Comments  lumbar and Rt hip ROM WNL      Strength   Overall Strength Comments  LE strength WNL bilat, fair core strength      Flexibility   Soft Tissue Assessment /Muscle Length  --   tight QL, hamstring,lumbar P.S, IT band on Rt side     Palpation   Spinal mobility  WNL and no pain reported    Palpation comment  TTP over Rt greater trochater      Special Tests   Other special tests  neg SLR test, neg FABERS test, no pain with spinal mobs      Transfers   Transfers  Independent with all Transfers      Ambulation/Gait   Gait Comments  WFL and independent without AD                Objective measurements completed on examination: See above findings.      [place] [facility] PT Treatment/Exercise - 12/07/19 0001      Modalities   Modalities  Electrical Stimulation;Moist Heat      Moist Heat Therapy   Number Minutes Moist Heat  15 Minutes    Moist Heat Location  Lumbar Spine      Electrical Stimulation   Electrical Stimulation Location  Lumbar    Electrical Stimulation Action  IFC    Electrical Stimulation Parameters  tolerance    Electrical  Stimulation Goals  Tone;Pain             PT Education - 12/07/19 1949    Education Details  HEP,POC,TENS    Person(s) Educated  Patient    Methods  Explanation;Demonstration;Verbal cues;Handout    Comprehension  Verbalized understanding;Need further instruction          PT Long Term Goals - 12/07/19 1955      PT LONG TERM GOAL #1   Title  Pt will be I and compliant with HEP. (Target for all goals 6 weeks 01/18/20)    Status  New      PT LONG TERM GOAL #2   Title  Pt will relay less than 2/10 pain with ususal activity, lifting, carrying, jogging    Status  New      PT LONG TERM GOAL #3   Title  Pt will improve core strength by holding plank 45 seconds    Status  New             Plan - 12/07/19 1950    Clinical Impression Statement  Pt presents with chronic LBP and Rt hip pain without radiculopathy. Pain appears to be more muscle strain and tension in nature. Special testing and XR were negative. He does have sings of Rt hip bursitis. Overall he has good lumbar ROM and leg strength but has decreased core strength, mild slumped posture, increased muscle tension, and increased pain limiting his functional abilties. He will benefit from skilled PT to addrss his deficitis.    Examination-Activity Limitations  Carry;Squat;Lift;Other   jog   Stability/Clinical Decision Making  Stable/Uncomplicated    Clinical Decision Making  Low    Rehab Potential  Good    PT Frequency  1x / week    PT Duration  6 weeks    PT Treatment/Interventions  ADLs/Self Care Home Management;Cryotherapy;Electrical Stimulation;Iontophoresis 4mg /ml Dexamethasone;Moist Heat;Traction;Ultrasound;Therapeutic activities;Therapeutic exercise;Neuromuscular re-education;Manual techniques;Dry needling;Passive range of motion;Spinal Manipulations;Joint Manipulations;Taping  PT Next Visit Plan  review HEP, needs core and lumbar.hip stretching    PT Home Exercise Plan  Access Code: [email or web address]    Consulted and  Agree with Plan of Care  Patient       Patient will benefit from skilled therapeutic intervention in order to improve the following deficits and impairments:  Decreased activity tolerance, Impaired flexibility, Increased muscle spasms, Increased fascial restricitons, Postural dysfunction, Pain  Visit Diagnosis: Chronic bilateral low back pain without sciatica  Pain in right hip     Problem List Patient Active Problem List   Diagnosis Date Noted  . Chronic hip pain, right 10/13/2019  . Healthcare maintenance 09/29/2019  . Persistent depressive disorder with mixed features, currently mild 09/18/2018  . GAD (generalized anxiety disorder) 08/31/2018  . Mixed obsessional thoughts and acts 08/31/2018  . Episodic tension-type headache, not intractable 09/04/2017    Birdie Riddle 12/07/2019, 7:57 PM  [facility] [address] [place], [place], [address] Phone: [phone]   Fax:  [phone]  Name: David Sweeney MRN: [id number] Date of Birth: 2001-10-26

## 2019-12-30 MED FILL — PROMETHAZINE 25 MG TABLET: 25 | 3 days supply | Qty: 10 | Fill #0

## 2019-12-30 MED FILL — HYDROCODON-APAP 7.5-325: 7.5-325 | 4 days supply | Qty: 20 | Fill #0

## 2020-01-04 MED FILL — clomiPRAMINE HCL 75 MG CAPS: 75 | 30 days supply | Qty: 30 | Fill #1

## 2020-01-07 ENCOUNTER — Ambulatory Visit (INDEPENDENT_AMBULATORY_CARE_PROVIDER_SITE_OTHER): Payer: 59 | Admitting: Psychiatry

## 2020-01-07 ENCOUNTER — Other Ambulatory Visit: Payer: Self-pay

## 2020-01-07 ENCOUNTER — Encounter: Payer: Self-pay | Admitting: Psychiatry

## 2020-01-07 VITALS — Ht 70.0 in | Wt 206.0 lb

## 2020-01-07 DIAGNOSIS — F411 Generalized anxiety disorder: Secondary | ICD-10-CM

## 2020-01-07 DIAGNOSIS — F323 Major depressive disorder, single episode, severe with psychotic features: Secondary | ICD-10-CM | POA: Diagnosis not present

## 2020-01-07 DIAGNOSIS — F422 Mixed obsessional thoughts and acts: Secondary | ICD-10-CM

## 2020-01-07 DIAGNOSIS — F341 Dysthymic disorder: Secondary | ICD-10-CM

## 2020-01-07 MED ORDER — ASENAPINE MALEATE 2.5 MG SL SUBL: 2.5000 mg | SUBLINGUAL_TABLET | Freq: Two times a day (BID) | SUBLINGUAL | 1 refills | Status: DC
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
## 2020-01-08 DIAGNOSIS — F323 Major depressive disorder, single episode, severe with psychotic features: Secondary | ICD-10-CM | POA: Insufficient documentation

## 2020-01-08 DIAGNOSIS — F331 Major depressive disorder, recurrent, moderate: Secondary | ICD-10-CM | POA: Insufficient documentation

## 2020-01-11 ENCOUNTER — Telehealth: Payer: Self-pay

## 2020-01-11 MED FILL — ASENAPINE MALEATE 2.5 MG SU: 2.5 | 30 days supply | Qty: 60 | Fill #0

## 2020-01-11 NOTE — Telephone Encounter (Signed)
Received request for Prior Authorization on patient's Saphris (asenapine) 2.5 mg 1 bid #60/30 day from [facility]. Spoke with [employer] and no prior authorization is required for [facility]. Patient is good with the generic per Dr. Marlyne Beards. Informed his pharmacy and to contact office for further questions.

## 2020-02-04 ENCOUNTER — Ambulatory Visit: Payer: 59 | Attending: Internal Medicine

## 2020-02-04 DIAGNOSIS — Z23 Encounter for immunization: Secondary | ICD-10-CM

## 2020-02-04 MED FILL — clomiPRAMINE HCL 75 MG CAPS: 75 | 30 days supply | Qty: 30 | Fill #2

## 2020-02-04 MED FILL — QVAR REDIHALER 80 MCG/ACT A: 80 | 30 days supply | Qty: 11 | Fill #3

## 2020-02-04 NOTE — Progress Notes (Signed)
   Covid-19 Vaccination Clinic  Name:  David Sweeney    MRN: [id number] DOB: 22-Oct-2001  02/04/2020  Mr. David Sweeney was observed post Covid-19 immunization for 15 minutes without incident. He was provided with Vaccine Information Sheet and instruction to access the V-Safe system.   Mr. David Sweeney was instructed to call 911 with any severe reactions post vaccine: Marland Kitchen Difficulty breathing  . Swelling of face and throat  . A fast heartbeat  . A bad rash all over body  . Dizziness and weakness   Immunizations Administered    Name Date Dose VIS Date Route   Pfizer COVID-19 Vaccine 02/04/2020 11:22 AM 0.3 mL 10/09/2019 Intramuscular   Manufacturer: [employer], [employer]   Lot: [id number]   NDC: [address]-9

## 2020-02-11 MED FILL — ASENAPINE MALEATE 2.5 MG SU: 2.5 | 30 days supply | Qty: 60 | Fill #1

## 2020-03-01 ENCOUNTER — Other Ambulatory Visit: Payer: Self-pay

## 2020-03-01 ENCOUNTER — Ambulatory Visit: Payer: 59 | Attending: Internal Medicine

## 2020-03-01 ENCOUNTER — Encounter: Payer: Self-pay | Admitting: Psychiatry

## 2020-03-01 ENCOUNTER — Ambulatory Visit (INDEPENDENT_AMBULATORY_CARE_PROVIDER_SITE_OTHER): Payer: 59 | Admitting: Psychiatry

## 2020-03-01 VITALS — Ht 70.0 in | Wt 220.0 lb

## 2020-03-01 DIAGNOSIS — F341 Dysthymic disorder: Secondary | ICD-10-CM | POA: Diagnosis not present

## 2020-03-01 DIAGNOSIS — F411 Generalized anxiety disorder: Secondary | ICD-10-CM

## 2020-03-01 DIAGNOSIS — F321 Major depressive disorder, single episode, moderate: Secondary | ICD-10-CM

## 2020-03-01 DIAGNOSIS — F323 Major depressive disorder, single episode, severe with psychotic features: Secondary | ICD-10-CM

## 2020-03-01 DIAGNOSIS — F422 Mixed obsessional thoughts and acts: Secondary | ICD-10-CM | POA: Diagnosis not present

## 2020-03-01 DIAGNOSIS — Z23 Encounter for immunization: Secondary | ICD-10-CM

## 2020-03-01 MED ORDER — REXULTI 0.5 MG PO TABS: 0.5000 mg | ORAL_TABLET | Freq: Every day | ORAL | 0 refills | Status: DC

## 2020-03-01 MED ORDER — CLOMIPRAMINE HCL 75 MG PO CAPS: 75.0000 mg | ORAL_CAPSULE | Freq: Every day | ORAL | 2 refills | Status: DC

## 2020-03-01 MED ORDER — BREXPIPRAZOLE 1 MG PO TABS: 1.0000 mg | ORAL_TABLET | Freq: Every day | ORAL | 0 refills | Status: DC

## 2020-03-01 MED FILL — clomiPRAMINE HCL 75 MG CAPS: 75 | 30 days supply | Qty: 30 | Fill #0

## 2020-03-01 MED FILL — QVAR REDIHALER 80 MCG/ACT A: 80 | 30 days supply | Qty: 11 | Fill #0

## 2020-03-01 NOTE — Progress Notes (Signed)
   Covid-19 Vaccination Clinic  Name:  David Sweeney    MRN: [id number] DOB: 2001/09/26  03/01/2020  [name] was observed post Covid-19 immunization for 15 minutes without incident. He was provided with Vaccine Information Sheet and instruction to access the V-Safe system.   [name] was instructed to call 911 with any severe reactions post vaccine: Marland Kitchen Difficulty breathing  . Swelling of face and throat  . A fast heartbeat  . A bad rash all over body  . Dizziness and weakness   Immunizations Administered    Name Date Dose VIS Date Route   Pfizer COVID-19 Vaccine 03/01/2020  8:14 AM 0.3 mL 12/23/2018 Intramuscular   Manufacturer: [employer], [employer]   Lot: [id number]   NDC: [address]-9
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
## 2020-03-07 DIAGNOSIS — F332 Major depressive disorder, recurrent severe without psychotic features: Secondary | ICD-10-CM | POA: Diagnosis not present

## 2020-03-29 ENCOUNTER — Ambulatory Visit (INDEPENDENT_AMBULATORY_CARE_PROVIDER_SITE_OTHER): Payer: 59 | Admitting: Psychiatry

## 2020-03-29 ENCOUNTER — Other Ambulatory Visit: Payer: Self-pay

## 2020-03-29 ENCOUNTER — Encounter: Payer: Self-pay | Admitting: Psychiatry

## 2020-03-29 VITALS — Ht 70.0 in | Wt 207.0 lb

## 2020-03-29 DIAGNOSIS — F422 Mixed obsessional thoughts and acts: Secondary | ICD-10-CM | POA: Diagnosis not present

## 2020-03-29 DIAGNOSIS — F321 Major depressive disorder, single episode, moderate: Secondary | ICD-10-CM

## 2020-03-29 DIAGNOSIS — F341 Dysthymic disorder: Secondary | ICD-10-CM | POA: Diagnosis not present

## 2020-03-29 DIAGNOSIS — F411 Generalized anxiety disorder: Secondary | ICD-10-CM

## 2020-03-29 MED ORDER — BREXPIPRAZOLE 1 MG PO TABS: 1.0000 mg | ORAL_TABLET | Freq: Every day | ORAL | 0 refills | Status: DC

## 2020-03-29 MED ORDER — CLOMIPRAMINE HCL 75 MG PO CAPS: 75.0000 mg | ORAL_CAPSULE | Freq: Every day | ORAL | 0 refills | Status: DC

## 2020-03-29 MED ORDER — CLOMIPRAMINE HCL 75 MG PO CAPS: 150.0000 mg | ORAL_CAPSULE | Freq: Every day | ORAL | 0 refills | Status: DC
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
## 2020-03-30 DIAGNOSIS — H5213 Myopia, bilateral: Secondary | ICD-10-CM | POA: Diagnosis not present

## 2020-03-30 MED FILL — clomiPRAMINE HCL 75 MG CAPS: 75 | 90 days supply | Qty: 180 | Fill #0

## 2020-04-18 ENCOUNTER — Telehealth: Payer: Self-pay | Admitting: Psychiatry

## 2020-04-18 DIAGNOSIS — F422 Mixed obsessional thoughts and acts: Secondary | ICD-10-CM

## 2020-04-18 DIAGNOSIS — F321 Major depressive disorder, single episode, moderate: Secondary | ICD-10-CM

## 2020-04-18 MED ORDER — BREXPIPRAZOLE 1 MG PO TABS: 1.0000 mg | ORAL_TABLET | Freq: Every day | ORAL | 0 refills | Status: DC

## 2020-04-18 MED FILL — REXULTI 1 MG TABLET: 1 | 90 days supply | Qty: 90 | Fill #0

## 2020-04-18 NOTE — Telephone Encounter (Signed)
The 90-day supply sent to [facility] outpatient pharmacy on 03/29/2020 was in epic logged as sample rather than normal therefore resent and 2 more weeks of samples provided to mother and patient as another copay coupon being picked up with samples until economics can be clarified.

## 2020-05-04 ENCOUNTER — Ambulatory Visit (INDEPENDENT_AMBULATORY_CARE_PROVIDER_SITE_OTHER): Payer: 59 | Admitting: Psychiatry

## 2020-05-04 ENCOUNTER — Other Ambulatory Visit: Payer: Self-pay

## 2020-05-04 ENCOUNTER — Encounter: Payer: Self-pay | Admitting: Psychiatry

## 2020-05-04 VITALS — Ht 70.0 in | Wt 210.0 lb

## 2020-05-04 DIAGNOSIS — F22 Delusional disorders: Secondary | ICD-10-CM | POA: Diagnosis not present

## 2020-05-04 DIAGNOSIS — F341 Dysthymic disorder: Secondary | ICD-10-CM | POA: Diagnosis not present

## 2020-05-04 DIAGNOSIS — F411 Generalized anxiety disorder: Secondary | ICD-10-CM

## 2020-05-04 DIAGNOSIS — F422 Mixed obsessional thoughts and acts: Secondary | ICD-10-CM | POA: Diagnosis not present
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
## 2020-06-22 ENCOUNTER — Encounter: Payer: Self-pay | Admitting: Psychiatry

## 2020-06-22 ENCOUNTER — Other Ambulatory Visit: Payer: Self-pay

## 2020-06-22 ENCOUNTER — Ambulatory Visit (INDEPENDENT_AMBULATORY_CARE_PROVIDER_SITE_OTHER): Payer: 59 | Admitting: Psychiatry

## 2020-06-22 VITALS — Ht 70.0 in | Wt 221.0 lb

## 2020-06-22 DIAGNOSIS — F422 Mixed obsessional thoughts and acts: Secondary | ICD-10-CM | POA: Diagnosis not present

## 2020-06-22 DIAGNOSIS — F22 Delusional disorders: Secondary | ICD-10-CM

## 2020-06-22 DIAGNOSIS — F411 Generalized anxiety disorder: Secondary | ICD-10-CM

## 2020-06-22 DIAGNOSIS — F331 Major depressive disorder, recurrent, moderate: Secondary | ICD-10-CM

## 2020-06-22 MED ORDER — CLOMIPRAMINE HCL 75 MG PO CAPS: 150.0000 mg | ORAL_CAPSULE | Freq: Every day | ORAL | 0 refills | Status: DC

## 2020-06-22 MED ORDER — BREXPIPRAZOLE 2 MG PO TABS: 2.0000 mg | ORAL_TABLET | Freq: Every day | ORAL | 0 refills | Status: DC

## 2020-06-22 MED FILL — REXULTI 2 MG TABLET: 2 | 90 days supply | Qty: 90 | Fill #0
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
## 2020-06-23 ENCOUNTER — Encounter: Payer: Self-pay | Admitting: Psychiatry

## 2020-08-09 DIAGNOSIS — Z1159 Encounter for screening for other viral diseases: Secondary | ICD-10-CM | POA: Diagnosis not present

## 2020-08-17 ENCOUNTER — Encounter: Payer: Self-pay | Admitting: Psychiatry

## 2020-08-30 ENCOUNTER — Other Ambulatory Visit (HOSPITAL_COMMUNITY): Payer: Self-pay | Admitting: Allergy and Immunology

## 2020-08-30 DIAGNOSIS — J4530 Mild persistent asthma, uncomplicated: Secondary | ICD-10-CM | POA: Diagnosis not present

## 2020-08-30 DIAGNOSIS — R21 Rash and other nonspecific skin eruption: Secondary | ICD-10-CM | POA: Diagnosis not present

## 2020-08-30 DIAGNOSIS — J310 Chronic rhinitis: Secondary | ICD-10-CM | POA: Diagnosis not present

## 2020-08-30 MED FILL — ALBUTEROL SULFATE HFA 108 (: 108 (90 BAS | 17 days supply | Qty: 18 | Fill #0

## 2020-08-31 ENCOUNTER — Other Ambulatory Visit (HOSPITAL_COMMUNITY): Payer: Self-pay | Admitting: Allergy and Immunology

## 2020-08-31 MED FILL — FLUTICASONE PROP 50 MCG SPR: 50 | 60 days supply | Qty: 16 | Fill #0

## 2020-08-31 MED FILL — FLOVENT HFA 110 MCG INHALER: 110 | 30 days supply | Qty: 12 | Fill #0

## 2020-09-13 ENCOUNTER — Ambulatory Visit: Payer: 59 | Admitting: Psychiatry

## 2020-11-08 ENCOUNTER — Other Ambulatory Visit (HOSPITAL_COMMUNITY): Payer: Self-pay | Admitting: Psychiatry

## 2020-11-08 ENCOUNTER — Ambulatory Visit (INDEPENDENT_AMBULATORY_CARE_PROVIDER_SITE_OTHER): Payer: 59 | Admitting: Psychiatry

## 2020-11-08 ENCOUNTER — Other Ambulatory Visit: Payer: Self-pay

## 2020-11-08 ENCOUNTER — Encounter: Payer: Self-pay | Admitting: Psychiatry

## 2020-11-08 DIAGNOSIS — F411 Generalized anxiety disorder: Secondary | ICD-10-CM

## 2020-11-08 DIAGNOSIS — F331 Major depressive disorder, recurrent, moderate: Secondary | ICD-10-CM | POA: Diagnosis not present

## 2020-11-08 DIAGNOSIS — F422 Mixed obsessional thoughts and acts: Secondary | ICD-10-CM

## 2020-11-08 MED ORDER — CLOMIPRAMINE HCL 75 MG PO CAPS: 150.0000 mg | ORAL_CAPSULE | Freq: Every day | ORAL | 1 refills | Status: DC

## 2020-11-08 MED FILL — clomiPRAMINE HCL 75 MG CAPS: 75 | 90 days supply | Qty: 180 | Fill #0

## 2020-11-08 NOTE — Progress Notes (Signed)
   11/08/20 1435  Facial and Oral Movements  Muscles of Facial Expression 0  Lips and Perioral Area 0  Jaw 0  Tongue 0  Extremity Movements  Upper (arms, wrists, hands, fingers) 0  Lower (legs, knees, ankles, toes) 0  Trunk Movements  Neck, shoulders, hips 0  Overall Severity  Severity of abnormal movements (highest score from questions above) 0  Incapacitation due to abnormal movements 0  Patient's awareness of abnormal movements (rate only patient's report) 0  AIMS Total Score  AIMS Total Score 0

## 2020-11-08 NOTE — Progress Notes (Signed)
AZION CENTRELLA [id number] 29-Jun-2001 19 y.o.  Subjective:   Patient ID:  David Sweeney is a 20 y.o. (DOB 06-14-2001) male.  Chief Complaint:  Chief Complaint  Patient presents with  . Follow-up    OCD, depression    HPI David Sweeney presents to the office today for follow-up of OCD and depression.  Patient was previously seen by Dr. Marlyne Beards and care is being transferred to this provider due to Dr. Marlyne Beards retirement. Rexulti was increased at last visit. He denies any side effects. He reports that he has not been taking Rexulti recently and last time he took it was in October or November. He reports that he has difficulty remembering to take it and thinks "I probably don't need it." He reports that he takes Clomipramine about every other day. He reports that he has been doing "really well compared to the last time I was here." He reports that OCD has improved. Had intrusive thoughts for about a month and this has improved. He reports periods of depression lasting about a week. He reports that he has only had 1-2 periods of depression since late August. He reports that his mood has ben stable overall. He reports that he had some "burn out" from August- October. He reports that his motivation is ok now.Has not been wanting to do personal projects. He has been staying up late with partner. He reports that he gets about 7 hours of sleep most nights. Appetite has been ok and may be overeating at times. Energy is ok and relies on coffee (1-2 cups qd). Concentration has been ok overall with some occasional distractibility. Denies any recent SI.   He reports that he has not had any further psychological evaluation.   He is not in school and reports "I forgot to sign up for this semester." Has been taking classes at [employer]. Currently not employed. Lives with parents and siblings.   AIMS   Flowsheet Row Office Visit from 11/08/2020 in [facility]  AIMS Total Score 0    PHQ2-9    Flowsheet Row Office Visit from 10/13/2019 in [facility] Visit from 09/29/2019 in [facility] Total Score 2 3  PHQ-9 Total Score 16 16       Review of Systems:  Review of Systems  Musculoskeletal: Positive for back pain. Negative for gait problem.  Neurological: Negative for tremors.  Psychiatric/Behavioral:       Please refer to HPI    Medications: I have reviewed the patient's current medications.  Current Outpatient Medications  Medication Sig Dispense Refill  . fluticasone (FLONASE) 50 MCG/ACT nasal spray Place 1 spray into both nostrils daily.    Marland Kitchen QVAR REDIHALER 40 MCG/ACT inhaler     . brexpiprazole (REXULTI) 2 MG TABS tablet Take 1 tablet (2 mg total) by mouth daily. (Patient not taking: Reported on 11/08/2020) 14 tablet 0  . clomiPRAMINE (ANAFRANIL) 75 MG capsule Take 2 capsules (150 mg total) by mouth at bedtime. 180 capsule 1   No current facility-administered medications for this visit.    Medication Side Effects: None  Allergies: No Known Allergies  Past Medical History:  Diagnosis Date  . Asthma     Family History  Problem Relation Age of Onset  . Hypertension Father   . Depression Sister   . Cancer Maternal Grandmother        colon, appendix  . Depression Maternal Grandmother   .  Hypertension Maternal Grandmother   . Diabetes Paternal Grandfather   . Depression Maternal Aunt   . Alcohol abuse Paternal Aunt   . Depression Paternal Aunt     Social History   Socioeconomic History  . Marital status: Single    Spouse name: Not on file  . Number of children: Not on file  . Years of education: Not on file  . Highest education level: Not on file  Occupational History  . Not on file  Tobacco Use  . Smoking status: Never Smoker  . Smokeless tobacco: Never Used  Vaping Use  . Vaping Use: Never used  Substance and Sexual Activity  . Alcohol use: Yes    Alcohol/week: 2.0 standard  drinks    Types: 2 Cans of beer per week  . Drug use: Not Currently    Types: Marijuana  . Sexual activity: Not Currently  Other Topics Concern  . 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 He lives with parents and siblings. He enjoys video games, listening to music, and hanging out with friends.    Social Determinants of Health   Financial Resource Strain: Not on file  Food Insecurity: Not on file  Transportation Needs: Not on file  Physical Activity: Not on file  Stress: Not on file  Social Connections: Not on file  Intimate Partner Violence: Not on file    Past Medical History, Surgical history, Social history, and Family history were reviewed and updated as appropriate.   Please see review of systems for further details on the patient's review from today.   Objective:   Physical Exam:  There were no vitals taken for this visit.  Physical Exam Constitutional:      General: He is not in acute distress. Musculoskeletal:        General: No deformity.  Neurological:     Mental Status: He is alert and oriented to person, place, and time.     Coordination: Coordination normal.  Psychiatric:        Attention and Perception: Attention and perception normal. He does not perceive auditory or visual hallucinations.        Mood and Affect: Mood is anxious. Mood is not depressed. Affect is blunt. Affect is not labile, angry or inappropriate.        Speech: Speech normal.        Behavior: Behavior is cooperative.        Thought Content: Thought content normal. Thought content is not paranoid or delusional. Thought content does not include homicidal or suicidal ideation. Thought content does not include homicidal or suicidal plan.        Cognition and Memory: Cognition and memory normal.        Judgment: Judgment normal.     Comments: Insight intact     Lab Review:  No results found for: NA, K, CL, CO2,  GLUCOSE, BUN, CREATININE, CALCIUM, PROT, ALBUMIN, AST, ALT, ALKPHOS, BILITOT, GFRNONAA, GFRAA  No results found for: WBC, RBC, HGB, HCT, PLT, MCV, MCH, MCHC, RDW, LYMPHSABS, MONOABS, EOSABS, BASOSABS  No results found for: POCLITH, LITHIUM   No results found for: PHENYTOIN, PHENOBARB, VALPROATE, CBMZ   .res Assessment: Plan:   Patient indicates that he would like to continue medications without any changes. He reports that he does not want to restart Rexulti.  Advised patient to contact office if he experiences worsening signs and symptoms and  would like to resume Rexulti. Continue clomipramine 150 mg at bedtime. Patient to follow up in 3 months or sooner if clinically indicated. Patient advised to contact office with any questions, adverse effects, or acute worsening in signs and symptoms.  Clemens was seen today for follow-up.  Diagnoses and all orders for this visit:  Mixed obsessional thoughts and acts -     clomiPRAMINE (ANAFRANIL) 75 MG capsule; Take 2 capsules (150 mg total) by mouth at bedtime.  GAD (generalized anxiety disorder) -     clomiPRAMINE (ANAFRANIL) 75 MG capsule; Take 2 capsules (150 mg total) by mouth at bedtime.  Moderate recurrent major depression (HCC) -     clomiPRAMINE (ANAFRANIL) 75 MG capsule; Take 2 capsules (150 mg total) by mouth at bedtime.     Please see After Visit Summary for patient specific instructions.  Future Appointments  Date Time Provider Department Center  02/06/2021  1:00 PM Corie Chiquito, PMHNP [facility] None    No orders of the defined types were placed in this encounter.   -------------------------------

## 2021-02-06 ENCOUNTER — Encounter: Payer: Self-pay | Admitting: Psychiatry

## 2021-02-06 ENCOUNTER — Ambulatory Visit (INDEPENDENT_AMBULATORY_CARE_PROVIDER_SITE_OTHER): Payer: 59 | Admitting: Psychiatry

## 2021-02-06 ENCOUNTER — Other Ambulatory Visit: Payer: Self-pay

## 2021-02-06 ENCOUNTER — Other Ambulatory Visit (HOSPITAL_COMMUNITY): Payer: Self-pay

## 2021-02-06 DIAGNOSIS — F331 Major depressive disorder, recurrent, moderate: Secondary | ICD-10-CM

## 2021-02-06 DIAGNOSIS — F422 Mixed obsessional thoughts and acts: Secondary | ICD-10-CM | POA: Diagnosis not present

## 2021-02-06 DIAGNOSIS — F411 Generalized anxiety disorder: Secondary | ICD-10-CM | POA: Diagnosis not present

## 2021-02-06 MED ORDER — SERTRALINE HCL 50 MG PO TABS
ORAL_TABLET | ORAL | 1 refills | Status: DC
  Filled 2021-02-06: qty 60, 30d supply, fill #0
  Filled 2021-03-12: qty 60, 30d supply, fill #1

## 2021-02-06 NOTE — Progress Notes (Signed)
[name] [id number] May 31, 2001 20 y.o.  Subjective:   Patient ID:  David Sweeney is a 20 y.o. (DOB 2001/08/03) male.  Chief Complaint:  Chief Complaint  Patient presents with  . Depression  . Anxiety    HPI David Sweeney presents to the office today for follow-up of OCD and depression. Mother had surgery for breast cancer and is in chemo. His grandmother and aunt came down to help and aunt is now having to go back due to having her own health issue. He is now taking care of both his mother and grandmother, who has had colon cancer. He reports that grandmother had to leave her other living situation and now makes multiple demands of him to prepare her meals and take care of her dog.   He reports that his partner is supportive. He reports that his partner just came out to his family.   He reports that he thinks that he has been experiencing worsening depression. Aunt commented that he mumbles. He reports that he was using marijuana and has stopped this. He reports that he started drinking ETOH around his birthday and was up to 1-1.5 bottles of wine in a sitting. He then stopped ETOH a couple of weeks ago.   He reports that OCD s/s are worse when he has increased stress. He reports that OCD s/s have varied depending on stress. He reports that Clomipramine helps with OCD but causes him to feel more depressed. He reports occasional panic attacks when situational stress is higher. He reports some generalized anxiety and worry.   He reports mood is "depressed and miserable... at least every other day." He reports that he has been jumping from one thing to the next. He reports that he has been experiencing hyper-focus at times. Motivation is ok for some things and lower for others. He reports that his sleep varies from 4-10 hours a night. Appetite has been ok. Occasionally eating one large meal a day. He reports vague SI. Denies suicidal intent. He contracts for safety.   He reports  hallucinations only occur with severe stress or substance use.   He reports that when he is at his boyfriend's they will not allow him to help with housework and this is not helpful for his self-esteem.   Past Psychiatric Medication Trials: Clomipramine- helpful for OCD. Caused worsening depression.  Lexapro Luvox Zoloft Effexor XR Rexulti Saphris Klonopin    AIMS   Flowsheet Row Office Visit from 11/08/2020 in [facility]  AIMS Total Score 0    PHQ2-9   Flowsheet Row Office Visit from 10/13/2019 in [facility] Visit from 09/29/2019 in [facility] at [facility] Total Score 2 3  PHQ-9 Total Score 16 16       Review of Systems:  Review of Systems  Gastrointestinal: Negative.   Musculoskeletal: Negative for gait problem.  Allergic/Immunologic: Positive for environmental allergies.  Neurological: Negative for tremors and headaches.  Psychiatric/Behavioral:       Please refer to HPI    Medications: I have reviewed the patient's current medications.  Current Outpatient Medications  Medication Sig Dispense Refill  . fluticasone (FLONASE) 50 MCG/ACT nasal spray Place 1 spray into both nostrils daily.    Marland Kitchen QVAR REDIHALER 40 MCG/ACT inhaler     . sertraline (ZOLOFT) 50 MG tablet Take 1/2 tab by mouth daily for 2-4 days, then take 1 tab by mouth daily for 1 week,  then take 2 tabs by mouth daily 60 tablet 1  . brexpiprazole (REXULTI) 2 MG TABS tablet Take 1 tablet (2 mg total) by mouth daily. (Patient not taking: Reported on 11/08/2020) 14 tablet 0   No current facility-administered medications for this visit.    Medication Side Effects: Other: Increased depression with Clomipramine  Allergies: No Known Allergies  Past Medical History:  Diagnosis Date  . Asthma     Family History  Problem Relation Age of Onset  . Hypertension Father   . Depression Sister   . Cancer Maternal Grandmother         colon, appendix  . Depression Maternal Grandmother   . Hypertension Maternal Grandmother   . Diabetes Paternal Grandfather   . Depression Maternal Aunt   . Alcohol abuse Paternal Aunt   . Depression Paternal Aunt     Social History   Socioeconomic History  . Marital status: Single    Spouse name: Not on file  . Number of children: Not on file  . Years of education: Not on file  . Highest education level: Not on file  Occupational History  . Not on file  Tobacco Use  . Smoking status: Never Smoker  . Smokeless tobacco: Never Used  Vaping Use  . Vaping Use: Never used  Substance and Sexual Activity  . Alcohol use: Yes    Alcohol/week: 2.0 standard drinks    Types: 2 Cans of beer per week  . Drug use: Not Currently    Types: Marijuana  . Sexual activity: Not Currently  Other Topics Concern  . Not on file  Social History Narrative   David Sweeney is in the 12th grade at [employer]; he does well in school. He lives with parents and siblings. He enjoys video games, listening to music, and hanging out with friends.    Social Determinants of Health   Financial Resource Strain: Not on file  Food Insecurity: Not on file  Transportation Needs: Not on file  Physical Activity: Not on file  Stress: Not on file  Social Connections: Not on file  Intimate Partner Violence: Not on file    Past Medical History, Surgical history, Social history, and Family history were reviewed and updated as appropriate.   Please see review of systems for further details on the patient's review from today.   Objective:   Physical Exam:  There were no vitals taken for this visit.  Physical Exam Constitutional:      General: He is not in acute distress. Musculoskeletal:        General: No deformity.  Neurological:     Mental Status: He is alert and oriented to person, place, and time.     Coordination: Coordination normal.  Psychiatric:        Attention and Perception: Attention and  perception normal. He does not perceive auditory or visual hallucinations.        Mood and Affect: Mood is anxious and depressed. Affect is not labile, blunt, angry or inappropriate.        Speech: Speech normal.        Behavior: Behavior normal.        Thought Content: Thought content normal. Thought content is not paranoid or delusional. Thought content does not include homicidal or suicidal ideation. Thought content does not include homicidal or suicidal plan.        Cognition and Memory: Cognition and memory normal.        Judgment: Judgment normal.  Comments: Insight intact     Lab Review:  No results found for: NA, K, CL, CO2, GLUCOSE, BUN, CREATININE, CALCIUM, PROT, ALBUMIN, AST, ALT, ALKPHOS, BILITOT, GFRNONAA, GFRAA  No results found for: WBC, RBC, HGB, HCT, PLT, MCV, MCH, MCHC, RDW, LYMPHSABS, MONOABS, EOSABS, BASOSABS  No results found for: POCLITH, LITHIUM   No results found for: PHENYTOIN, PHENOBARB, VALPROATE, CBMZ   .res Assessment: Plan:    Patient seen for 30 minutes and time spent counseling patient regarding possible treatment options since he reports that he would like to initiate treatment with depression and prefers medication to also be helpful for anxiety signs and symptoms.  Reviewed past medication trials.  Patient indicates that he would like to start treatment with sertraline since this may have been effective in the past and well-tolerated.  Will start sertraline 25 mg daily for 2 to 4 days, then increase to 50 mg daily for 1 week, then increase to 100 mg daily for anxiety and depression. Patient reports that he continues to take Rexulti on occasion.  Discussed that taking Rexulti regular early may help depression as well.  Patient reports that he does not need a refill of Rexulti at this time and has supply and remaining at home. Patient to follow-up in 2 months or sooner if clinically indicated. Patient advised to contact office with any questions, adverse  effects, or acute worsening in signs and symptoms.   Jerman was seen today for depression and anxiety.  Diagnoses and all orders for this visit:  GAD (generalized anxiety disorder) -     sertraline (ZOLOFT) 50 MG tablet; Take 1/2 tab by mouth daily for 2-4 days, then take 1 tab by mouth daily for 1 week, then take 2 tabs by mouth daily  Mixed obsessional thoughts and acts -     sertraline (ZOLOFT) 50 MG tablet; Take 1/2 tab by mouth daily for 2-4 days, then take 1 tab by mouth daily for 1 week, then take 2 tabs by mouth daily  Moderate recurrent major depression (HCC) -     sertraline (ZOLOFT) 50 MG tablet; Take 1/2 tab by mouth daily for 2-4 days, then take 1 tab by mouth daily for 1 week, then take 2 tabs by mouth daily     Please see After Visit Summary for patient specific instructions.  Future Appointments  Date Time Provider Department Center  04/10/2021  1:30 PM Corie Chiquito, PMHNP [facility] None    No orders of the defined types were placed in this encounter.   -------------------------------

## 2021-03-13 ENCOUNTER — Other Ambulatory Visit (HOSPITAL_COMMUNITY): Payer: Self-pay

## 2021-03-14 ENCOUNTER — Other Ambulatory Visit (HOSPITAL_COMMUNITY): Payer: Self-pay

## 2021-04-10 ENCOUNTER — Ambulatory Visit: Payer: 59 | Admitting: Psychiatry

## 2021-04-26 ENCOUNTER — Other Ambulatory Visit: Payer: Self-pay | Admitting: Psychiatry

## 2021-04-26 ENCOUNTER — Other Ambulatory Visit (HOSPITAL_COMMUNITY): Payer: Self-pay

## 2021-04-26 DIAGNOSIS — F331 Major depressive disorder, recurrent, moderate: Secondary | ICD-10-CM

## 2021-04-26 DIAGNOSIS — F422 Mixed obsessional thoughts and acts: Secondary | ICD-10-CM

## 2021-04-26 DIAGNOSIS — F411 Generalized anxiety disorder: Secondary | ICD-10-CM

## 2021-04-27 NOTE — Telephone Encounter (Signed)
Can you check if he is taking 100 mg ?

## 2021-05-02 ENCOUNTER — Other Ambulatory Visit: Payer: Self-pay | Admitting: Psychiatry

## 2021-05-02 ENCOUNTER — Other Ambulatory Visit (HOSPITAL_COMMUNITY): Payer: Self-pay

## 2021-05-02 DIAGNOSIS — F422 Mixed obsessional thoughts and acts: Secondary | ICD-10-CM

## 2021-05-02 DIAGNOSIS — F331 Major depressive disorder, recurrent, moderate: Secondary | ICD-10-CM

## 2021-05-02 DIAGNOSIS — F411 Generalized anxiety disorder: Secondary | ICD-10-CM

## 2021-05-02 MED ORDER — SERTRALINE HCL 50 MG PO TABS
ORAL_TABLET | ORAL | 1 refills | Status: DC
  Filled 2021-05-02: qty 60, 30d supply, fill #0

## 2021-05-05 ENCOUNTER — Other Ambulatory Visit: Payer: Self-pay

## 2021-05-05 ENCOUNTER — Other Ambulatory Visit (HOSPITAL_COMMUNITY): Payer: Self-pay

## 2021-05-05 ENCOUNTER — Encounter: Payer: Self-pay | Admitting: Psychiatry

## 2021-05-05 ENCOUNTER — Ambulatory Visit: Payer: 59 | Admitting: Psychiatry

## 2021-05-05 DIAGNOSIS — F411 Generalized anxiety disorder: Secondary | ICD-10-CM | POA: Diagnosis not present

## 2021-05-05 DIAGNOSIS — F331 Major depressive disorder, recurrent, moderate: Secondary | ICD-10-CM

## 2021-05-05 DIAGNOSIS — F422 Mixed obsessional thoughts and acts: Secondary | ICD-10-CM

## 2021-05-05 DIAGNOSIS — F22 Delusional disorders: Secondary | ICD-10-CM

## 2021-05-05 MED ORDER — BREXPIPRAZOLE 2 MG PO TABS
2.0000 mg | ORAL_TABLET | Freq: Every day | ORAL | 2 refills | Status: DC
  Filled 2021-05-05: qty 30, 30d supply, fill #0

## 2021-05-05 MED ORDER — SERTRALINE HCL 100 MG PO TABS
100.0000 mg | ORAL_TABLET | Freq: Every day | ORAL | 0 refills | Status: DC
  Filled 2021-05-05: qty 90, 90d supply, fill #0

## 2021-05-05 NOTE — Progress Notes (Signed)
[name] [id number] 2001-08-11 20 y.o.  Subjective:   Patient ID:  David Sweeney is a 20 y.o. (DOB 15-Mar-2001) male.  Chief Complaint:  Chief Complaint  Patient presents with   Anxiety   Depression    HPI David Sweeney presents to the office today for follow-up of anxiety and depression. He reports that his mother's chemotherapy seems to be coming to a conclusion. Grandmother moved out. Aunt  returned after having gallbladder surgery and is a help. Reports that boyfriend's father financially abandoned him and his mother. David Sweeney has been trying to help support boyfriend. Only able to see boyfriend about twice a week.   He reports that he was off Sertraline from mid-June until recently and started "spiraling" and having increased suicidal thoughts.  He reports that Sertraline was helpful for his anxiety and some for depression- "I wasn't as anxious and stressed out." He reports that he was able to re-start Sertraline today. He reports that he sometimes feels dismissed by others and that other family member's needs seem to come first. He reports that he tends to stay to himself and classifies himself as an "introvert."   He reports that several family members have commented that he may have ADHD and will frequently change topics in conversation. He reports that he has more difficulty with concentration now compared to when he was in school. He reports that he tends to be concrete and at times does not get sarcasm. He reports that he tends to hyper-focus on things he is interested in and will not notice someone trying to get his attention. Occasional difficulty sustaining focus over a long period of time. He reports that he will become interested in something, such as a game or project, and then no longer be interested. Occasionally making careless mistakes. Occasional difficulty keeping up with appointments and will ask others to remind him. He reports difficulty not interrupting others or complete  their sentences. Reports that he was quiet in elementary school and moved frequently in middle school and was not able to make friends easily. Reports that a few times in high school was distracted by talking with friends.   He reports that he feels he is now managing OCD s/s "better" compared to the past. He reports that depressed mood will fluctuate. Denies elevated mood. He reports occasional irritation in response to world events and psychosocial stressors. Energy and motivation tend to fluctuate. Sleeping ok and averages about 8 hours of sleep a night. He reports that he has been hungrier than usual.   Denies current SI. He reports that he is able "to dismiss" suicidal thoughts by thinking about how this would impact others. Denies HI.   Reports tension in relationship with sister who lives in the same household.   He reports that he has not been taking Rexulti or Clomipramine consistently.   He reports that he is using edibles on occasion.   Past Psychiatric Medication Trials: Clomipramine- helpful for OCD. Caused worsening depression. Lexapro Luvox Zoloft Effexor XR Rexulti Saphris Klonopin  AIMS    Flowsheet Row Office Visit from 11/08/2020 in [facility]  AIMS Total Score 0      PHQ2-9    Flowsheet Row Office Visit from 10/13/2019 in [facility] at [facility] Visit from 09/29/2019 in [facility] at [facility] Total Score 2 3  PHQ-9 Total Score 16 16        Review of Systems:  Review  of Systems  Gastrointestinal:        IBS s/s  Musculoskeletal:  Negative for gait problem.  Neurological:  Negative for tremors.  Psychiatric/Behavioral:         Please refer to HPI   Medications: I have reviewed the patient's current medications.  Current Outpatient Medications  Medication Sig Dispense Refill   fluticasone (FLONASE) 50 MCG/ACT nasal spray Place 1 spray into both nostrils daily.     fluticasone (FLONASE)  50 MCG/ACT nasal spray PLACE 1 SPRAY INTO EACH NOSTRIL ONCE DAILY. 16 g 5   fluticasone (FLOVENT HFA) 110 MCG/ACT inhaler INHALE 2 PUFFS BY MOUTH INTO THE LUNGS TWICE A DAY 12 g 5   QVAR REDIHALER 40 MCG/ACT inhaler      albuterol (VENTOLIN HFA) 108 (90 Base) MCG/ACT inhaler INHALE 2 PUFFS BY MOUTH EVERY 4 TO 6 HOURS AS NEEDED FOR COUGH AND WHEEZE 18 g 0   brexpiprazole (REXULTI) 2 MG TABS tablet Take 1 tablet (2 mg total) by mouth daily. 30 tablet 2   sertraline (ZOLOFT) 100 MG tablet Take 1 tablet (100 mg total) by mouth daily. 90 tablet 0   No current facility-administered medications for this visit.    Medication Side Effects: Other: Sexual side effects  Allergies: No Known Allergies  Past Medical History:  Diagnosis Date   Asthma     Past Medical History, Surgical history, Social history, and Family history were reviewed and updated as appropriate.   Please see review of systems for further details on the patient's review from today.   Objective:   Physical Exam:  There were no vitals taken for this visit.  Physical Exam Constitutional:      General: He is not in acute distress. Musculoskeletal:        General: No deformity.  Neurological:     Mental Status: He is alert and oriented to person, place, and time.     Coordination: Coordination normal.  Psychiatric:        Attention and Perception: Attention and perception normal. He does not perceive auditory or visual hallucinations.        Mood and Affect: Mood is anxious. Affect is not labile, blunt, angry or inappropriate.        Speech: Speech normal.        Behavior: Behavior normal.        Thought Content: Thought content normal. Thought content is not paranoid or delusional. Thought content does not include homicidal or suicidal ideation. Thought content does not include homicidal or suicidal plan.        Cognition and Memory: Cognition and memory normal.        Judgment: Judgment normal.     Comments: Insight  intact Mood presents as less depressed compared to previous exam    Lab Review:  No results found for: NA, K, CL, CO2, GLUCOSE, BUN, CREATININE, CALCIUM, PROT, ALBUMIN, AST, ALT, ALKPHOS, BILITOT, GFRNONAA, GFRAA  No results found for: WBC, RBC, HGB, HCT, PLT, MCV, MCH, MCHC, RDW, LYMPHSABS, MONOABS, EOSABS, BASOSABS  No results found for: POCLITH, LITHIUM   No results found for: PHENYTOIN, PHENOBARB, VALPROATE, CBMZ   .res Assessment: Plan:    Pt seen for 30 minutes and time spent counseling pt regarding mood and anxiety s/s. He reports that his mood and anxiety s/s have been improved with Sertraline. Will re-start Sertraline and continue at 100 mg daily.  Continue Rexulti 2 mg po qd for depression.  Discussed therapy referrals and referral made to  Elio Forget, [facility].  Pt to follow-up in 3 months or sooner if clinically indicated.  Patient advised to contact office with any questions, adverse effects, or acute worsening in signs and symptoms.   Leander was seen today for anxiety and depression.  Diagnoses and all orders for this visit:  Mixed obsessional thoughts and acts -     brexpiprazole (REXULTI) 2 MG TABS tablet; Take 1 tablet (2 mg total) by mouth daily. -     sertraline (ZOLOFT) 100 MG tablet; Take 1 tablet (100 mg total) by mouth daily.  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  GAD (generalized anxiety disorder) -     sertraline (ZOLOFT) 100 MG tablet; Take 1 tablet (100 mg total) by mouth daily.  Moderate recurrent major depression (HCC) -     sertraline (ZOLOFT) 100 MG tablet; Take 1 tablet (100 mg total) by mouth daily.    Please see After Visit Summary for patient specific instructions.  Future Appointments  Date Time Provider Department Center  06/13/2021  2:00 PM Waldron Session, [facility] None   08/08/2021  1:30 PM Corie Chiquito, PMHNP [facility] None    No orders of the defined types were placed in this encounter.   -------------------------------

## 2021-06-13 ENCOUNTER — Other Ambulatory Visit: Payer: Self-pay

## 2021-06-13 ENCOUNTER — Ambulatory Visit: Payer: 59 | Admitting: Mental Health

## 2021-06-13 DIAGNOSIS — F331 Major depressive disorder, recurrent, moderate: Secondary | ICD-10-CM

## 2021-06-13 NOTE — Progress Notes (Signed)
[facility] Initial Adult Exam  Name: SHERRY ROGUS Date: 06/13/2021 MRN: [id number] DOB: 2001/06/07 PCP: Pcp, No  Time spent: 50 minutes  Reason for Visit /Presenting Problem: Patient reports "a lot of stressors". Stated he copes w/ ADHD, tends to "mold myself to fit others expectations". His mother has cancer, is currently receiving radiations treatment. He lives w/ her, along w/ his aunt to help her. His mother tries to push herself, to often at times, where patient has been trying to keep up w/ household tasks. His father works often, a sister and brother who also lives at home. He feels ignored, "taken advantage of" at home. He knows he is living there rent-free but would like more help. His partner is part of his support system. Ray, they have been dating for the past year. Copes w/ feeling depression, but pushes self to get up out of bed. He has a poor self image, is self deprecating, sometimes copes w/ eating. Feels he is "shunned" at times by his family. Feels his parents give him "non-answers" at times. He wants to be more assertive w/ his communication when needed. Tends to say "I'm sorry" often, he wants to decrease this tendency. Family copes w/ financial stress. Complicated family hx w/ grandparents which will be further discussed next session.  Mental Status Exam:    Appearance:    Casual     Behavior:   Appropriate  Motor:   WNL  Speech/Language:    Clear and Coherent  Affect:   Full range   Mood:   Euthymic  Thought process:   normal  Thought content:     WNL  Sensory/Perceptual disturbances:     none  Orientation:   x4  Attention:   Good  Concentration:   Good  Memory:   Intact  Fund of knowledge:    Consistent with age and development  Insight:     Good  Judgment:    Good  Impulse Control:   Good     Reported Symptoms:  anxiety, neg self talk, stress-eating, "overly self conscious".  Risk Assessment: Danger to Self:  No Self-injurious Behavior: No Danger  to Others: No Duty to Warn:no Physical Aggression / Violence:No  Access to Firearms a concern: No  Gang Involvement:No  Patient / guardian was educated about steps to take if suicide or homicide risk level increases between visits: yes While future psychiatric events cannot be accurately predicted, the patient does not currently require acute inpatient psychiatric care and does not currently meet [facility] involuntary commitment criteria.  Substance Abuse History: Current substance abuse: none  Past Psychiatric History:   Outpatient Providers: therapy for 6 years History of Psych Hospitalization: No  Psychological Testing: none  Abuse History: Victim - none Report needed: No. Victim of Neglect:No. Perpetrator - none Witness / Exposure to Domestic Violence: none Protective Services Involvement: No  Witness to [employer] Violence:  No   Family History:  Raised by both parents. 1 sister-age 33, brother-age 41  Family History  Problem Relation Age of Onset   Hypertension Father    Depression Sister    Cancer Maternal Grandmother        colon, appendix   Depression Maternal Grandmother    Hypertension Maternal Grandmother    Diabetes Paternal Grandfather    Depression Maternal Aunt    Alcohol abuse Paternal Aunt    Depression Paternal Aunt     Living situation: the patient lives with their family  Sexual Orientation:  Cardell Peach  Relationship Status:  single  Support Systems; family, partner  Surveyor, quantity Stress:  yes  Income/Employment/Disability:  not working  Financial planner: No   Educational History: Education: [employer] Dipl[employer]  Any cultural differences that may affect / interfere with treatment:  none   Stressors:Financial difficulties  Strengths:  Supportive Relationships, Family, and partner  Barriers:  none  Legal History: Pending legal issue / charges: none History of legal issue / charges: none  Medical History/Surgical History: Past Medical History:   Diagnosis Date   Asthma     Past Surgical History:  Procedure Laterality Date   TESTICLE TORSION REDUCTION      Medications: Current Outpatient Medications  Medication Sig Dispense Refill   albuterol (VENTOLIN HFA) 108 (90 Base) MCG/ACT inhaler INHALE 2 PUFFS BY MOUTH EVERY 4 TO 6 HOURS AS NEEDED FOR COUGH AND WHEEZE 18 g 0   brexpiprazole (REXULTI) 2 MG TABS tablet Take 1 tablet (2 mg total) by mouth daily. 30 tablet 2   fluticasone (FLONASE) 50 MCG/ACT nasal spray Place 1 spray into both nostrils daily.     fluticasone (FLONASE) 50 MCG/ACT nasal spray PLACE 1 SPRAY INTO EACH NOSTRIL ONCE DAILY. 16 g 5   fluticasone (FLOVENT HFA) 110 MCG/ACT inhaler INHALE 2 PUFFS BY MOUTH INTO THE LUNGS TWICE A DAY 12 g 5   QVAR REDIHALER 40 MCG/ACT inhaler      sertraline (ZOLOFT) 100 MG tablet Take 1 tablet (100 mg total) by mouth daily. 90 tablet 0   No current facility-administered medications for this visit.    No Known Allergies  Diagnoses:    ICD-10-CM   1. Moderate recurrent major depression (HCC)  F33.1       Plan of Care: TBD   Waldron Session, [facility]

## 2021-07-19 ENCOUNTER — Other Ambulatory Visit: Payer: Self-pay

## 2021-07-19 ENCOUNTER — Ambulatory Visit: Payer: 59 | Admitting: Mental Health

## 2021-07-19 DIAGNOSIS — F331 Major depressive disorder, recurrent, moderate: Secondary | ICD-10-CM | POA: Diagnosis not present

## 2021-07-19 NOTE — Progress Notes (Signed)
[facility] psychotherapy Note  Name: David Sweeney Date: 07/19/2021 MRN: [id number] DOB: 2001-05-16 PCP: Pcp, No  Time spent: 50 minutes  Treatment:  Individual therapy  Mental Status Exam:    Appearance:    Casual     Behavior:   Appropriate  Motor:   WNL  Speech/Language:    Clear and Coherent  Affect:   Full range   Mood:   Euthymic  Thought process:   normal  Thought content:     WNL  Sensory/Perceptual disturbances:     none  Orientation:   x4  Attention:   Good  Concentration:   Good  Memory:   Intact  Fund of knowledge:    Consistent with age and development  Insight:     Good  Judgment:    Good  Impulse Control:   Good     Reported Symptoms:  anxiety, neg self talk, stress-eating, "overly self conscious".  Risk Assessment: Danger to Self:  No Self-injurious Behavior: No Danger to Others: No Duty to Warn:no Physical Aggression / Violence:No  Access to Firearms a concern: No  Gang Involvement:No  Patient / guardian was educated about steps to take if suicide or homicide risk level increases between visits: yes While future psychiatric events cannot be accurately predicted, the patient does not currently require acute inpatient psychiatric care and does not currently meet [facility] involuntary commitment criteria.     Medications: Current Outpatient Medications  Medication Sig Dispense Refill   albuterol (VENTOLIN HFA) 108 (90 Base) MCG/ACT inhaler INHALE 2 PUFFS BY MOUTH EVERY 4 TO 6 HOURS AS NEEDED FOR COUGH AND WHEEZE 18 g 0   brexpiprazole (REXULTI) 2 MG TABS tablet Take 1 tablet (2 mg total) by mouth daily. 30 tablet 2   fluticasone (FLONASE) 50 MCG/ACT nasal spray Place 1 spray into both nostrils daily.     fluticasone (FLONASE) 50 MCG/ACT nasal spray PLACE 1 SPRAY INTO EACH NOSTRIL ONCE DAILY. 16 g 5   fluticasone (FLOVENT HFA) 110 MCG/ACT inhaler INHALE 2 PUFFS BY MOUTH INTO THE LUNGS TWICE A DAY 12 g 5   QVAR REDIHALER 40 MCG/ACT  inhaler      sertraline (ZOLOFT) 100 MG tablet Take 1 tablet (100 mg total) by mouth daily. 90 tablet 0   No current facility-administered medications for this visit.   Subjective:  Patient presents for session on time. Reviewed some content from initial assessment regarding clinical history needs.  He continues to express that he feels "taken advantage of at home".  He stated that his sister works third shift and his 60 year old brother is minimally helpful.  He stated his aunt has been helpful as she has stayed with him since March.  He stated his mother continues her cancer radiation treatment, to be completed in about 3 weeks.  Albeit he is close to his parents, he does not feel validated particularly when he may make suggestions.  He feels they may view him as a child although he is an adult.  He shared how when he was in adolescence he had a tendency to lie more often and feels this may play a role in how they do not listen to him at times.  He stated his closest support is his boyfriend.  He stated that he is going through some issues as his parents are separating and he also had a pet passed away recently.  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  He stated that in the home- his parents, sister, brother, aunt (since March). Aunt came down from [place] to help due to his mother's medical issues. Mother is receiving radiation treatment, prognosis is good.    Diagnoses:    ICD-10-CM   1. 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   Long-term goal:   Reduce overall level, frequency, and intensity of the feelings of emotional distress that inadvertently affects patient's mood, functioning and relationships for at least 3 consecutive months oer patient report.    Short-term goal:   Decrease anxiety producing self talk such as thinking of the worse possible life outcome Increase assertiveness with his communication to family members to elicit more help with day-to-day chores and tasks as needed   Assessment of progress:  progressing   This record has been created using [employer].  Chart creation errors have been sought, but may not always have been located and corrected. Such creation errors do not reflect on the standard of medical care.    David Sweeney Session, [facility]

## 2021-07-31 ENCOUNTER — Ambulatory Visit: Payer: 59 | Admitting: Mental Health

## 2021-07-31 ENCOUNTER — Other Ambulatory Visit: Payer: Self-pay

## 2021-07-31 DIAGNOSIS — F331 Major depressive disorder, recurrent, moderate: Secondary | ICD-10-CM

## 2021-07-31 NOTE — Progress Notes (Signed)
[facility] psychotherapy Note  Name: David Sweeney Date: 07/31/2021 MRN: [id number] DOB: [birth year] PCP: Pcp, No  Time spent: 50 minutes  Treatment:  Individual therapy  Mental Status Exam:    Appearance:    Casual     Behavior:   Appropriate  Motor:   WNL  Speech/Language:    Clear and Coherent  Affect:   Full range   Mood:   Euthymic  Thought process:   normal  Thought content:     WNL  Sensory/Perceptual disturbances:     none  Orientation:   x4  Attention:   Good  Concentration:   Good  Memory:   Intact  Fund of knowledge:    Consistent with age and development  Insight:     Good  Judgment:    Good  Impulse Control:   Good     Reported Symptoms:  anxiety, neg self talk, stress-eating, "overly self conscious".  Risk Assessment: Danger to Self:  No Self-injurious Behavior: No Danger to Others: No Duty to Warn:no Physical Aggression / Violence:No  Access to Firearms a concern: No  Gang Involvement:No  Patient / guardian was educated about steps to take if suicide or homicide risk level increases between visits: yes While future psychiatric events cannot be accurately predicted, the patient does not currently require acute inpatient psychiatric care and does not currently meet [facility] involuntary commitment criteria.     Medications: Current Outpatient Medications  Medication Sig Dispense Refill   albuterol (VENTOLIN HFA) 108 (90 Base) MCG/ACT inhaler INHALE 2 PUFFS BY MOUTH EVERY 4 TO 6 HOURS AS NEEDED FOR COUGH AND WHEEZE 18 g 0   brexpiprazole (REXULTI) 2 MG TABS tablet Take 1 tablet (2 mg total) by mouth daily. 30 tablet 2   fluticasone (FLONASE) 50 MCG/ACT nasal spray Place 1 spray into both nostrils daily.     fluticasone (FLONASE) 50 MCG/ACT nasal spray PLACE 1 SPRAY INTO EACH NOSTRIL ONCE DAILY. 16 g 5   fluticasone (FLOVENT HFA) 110 MCG/ACT inhaler INHALE 2 PUFFS BY MOUTH INTO THE LUNGS TWICE A DAY 12 g 5   QVAR REDIHALER 40 MCG/ACT  inhaler      sertraline (ZOLOFT) 100 MG tablet Take 1 tablet (100 mg total) by mouth daily. 90 tablet 0   No current facility-administered medications for this visit.   Subjective:  Patient presents for session on time.  Facilitated patient in sharing progress, recent events.  The patient stated that he continues to have some feelings of resentment related to not getting enough help around the house as he tries to keep up with household chores.  He stated that he recognizes and understands his parents both working, his mother continuing to receive the treatment needed related to her cancer diagnosis which at this point, prognosis looks good.  He shared more relational experiences, history with his family members, specifically today with his brother, who is age 56 and is minimally helpful around the house.  He shared the challenge of being a sibling versus a parent when trying to give him directions to facilitate his being more helpful.  He shared other relationships, his partner who is supportive as well.  Patient went on to share how his partner struggling with his own stressors and how he tries to be supportive of him.  Ways to cope and care for himself, outlets for stress management, relaxation were explored collaboratively in today's session.     Diagnoses:    ICD-10-CM   1. Moderate recurrent  major depression (HCC)  F33.1         Plan: Patient is to use CBT, mindfulness and coping skills to help manage stress,, and identify outlets for stress management, relaxation.   Long-term goal:   Reduce overall level, frequency, and intensity of the feelings of emotional distress that inadvertently affects patient's mood, functioning and relationships for at least 3 consecutive months oer patient report.    Short-term goal:  Decrease anxiety producing self talk such as thinking of the worse possible life outcome Increase assertiveness with his communication to family members to elicit more help  with day-to-day chores and tasks as needed   Assessment of progress:  progressing   This record has been created using [employer].  Chart creation errors have been sought, but may not always have been located and corrected. Such creation errors do not reflect on the standard of medical care.    Waldron Session, [facility]

## 2021-08-08 ENCOUNTER — Ambulatory Visit: Payer: 59 | Admitting: Psychiatry

## 2021-08-15 ENCOUNTER — Ambulatory Visit (INDEPENDENT_AMBULATORY_CARE_PROVIDER_SITE_OTHER): Payer: 59 | Admitting: Mental Health

## 2021-08-15 ENCOUNTER — Other Ambulatory Visit: Payer: Self-pay

## 2021-08-15 DIAGNOSIS — F411 Generalized anxiety disorder: Secondary | ICD-10-CM

## 2021-08-15 NOTE — Progress Notes (Addendum)
[facility] psychotherapy Note  Name: David Sweeney Date: 08/15/2021 MRN: [id number] DOB: 02-09-2001 PCP: Pcp, No  Time spent: 57 minutes  Treatment:  Individual therapy  Mental Status Exam:    Appearance:    Casual     Behavior:   Appropriate  Motor:   WNL  Speech/Language:    Clear and Coherent  Affect:   Full range   Mood:   Euthymic  Thought process:   normal  Thought content:     WNL  Sensory/Perceptual disturbances:     none  Orientation:   x4  Attention:   Good  Concentration:   Good  Memory:   Intact  Fund of knowledge:    Consistent with age and development  Insight:     Good  Judgment:    Good  Impulse Control:   Good     Reported Symptoms:  anxiety, neg self talk, stress-eating, "overly self conscious".  Risk Assessment: Danger to Self:  No Self-injurious Behavior: No Danger to Others: No Duty to Warn:no Physical Aggression / Violence:No  Access to Firearms a concern: No  Gang Involvement:No  Patient / guardian was educated about steps to take if suicide or homicide risk level increases between visits: yes While future psychiatric events cannot be accurately predicted, the patient does not currently require acute inpatient psychiatric care and does not currently meet [facility] involuntary commitment criteria.     Medications: Current Outpatient Medications  Medication Sig Dispense Refill   albuterol (VENTOLIN HFA) 108 (90 Base) MCG/ACT inhaler INHALE 2 PUFFS BY MOUTH EVERY 4 TO 6 HOURS AS NEEDED FOR COUGH AND WHEEZE 18 g 0   brexpiprazole (REXULTI) 2 MG TABS tablet Take 1 tablet (2 mg total) by mouth daily. 30 tablet 2   fluticasone (FLONASE) 50 MCG/ACT nasal spray Place 1 spray into both nostrils daily.     fluticasone (FLONASE) 50 MCG/ACT nasal spray PLACE 1 SPRAY INTO EACH NOSTRIL ONCE DAILY. 16 g 5   fluticasone (FLOVENT HFA) 110 MCG/ACT inhaler INHALE 2 PUFFS BY MOUTH INTO THE LUNGS TWICE A DAY 12 g 5   QVAR REDIHALER 40 MCG/ACT  inhaler      sertraline (ZOLOFT) 100 MG tablet Take 1 tablet (100 mg total) by mouth daily. 90 tablet 0   No current facility-administered medications for this visit.   Subjective:  Patient presents for session on time.  Assess progress.  He stated that his mother completed her last treatment of radiation to treat her cancer recently.  He stated that she has some upcoming appointments but is doing well with her recovery.  Patient also assists with his grandmother and getting her to appointments for her medical issues as well.  He continues to be the primary person in the home that keeps up with day-to-day tasks such as cleaning.  He stated that his brother has improved slightly giving some examples.  He shared how he would like to get a job in January, he looks forward to getting out of the house.  He wants to wait till January to get his mother time to fully recover from her treatment and she has plans to have a surgery next month as well.  He shared how he has been doing better with not apologizing when unnecessary.  He shared more history, how he had problems socially in high school and continues to have challenges, considers himself introverted.  Shared how he has had a tendency to apologize "I am sorry" for the past few  years where we explored collaboratively in session these tendencies.  He shared how he feels his medications have been helpful to a degree with his obsessive thinking which he states continues to persist in some ways.  Reports some respiratory paranoia at times but was able to share how he works to counter this type of thinking with "logical and rational thoughts.  Intervention: CBT, supportive therapy, motivational interviewing    Diagnoses:    ICD-10-CM   1. GAD (generalized anxiety disorder)  F41.1          Plan: Patient is to use CBT, mindfulness and coping skills to help manage stress,, and identify outlets for stress management, relaxation.  Patient to continue to  ground himself with logical, rational thoughts when feeling anxious.   Long-term goal:   Reduce overall level, frequency, and intensity of the feelings of emotional distress that inadvertently affects patient's mood, functioning and relationships for at least 3 consecutive months oer patient report.    Short-term goal:  Decrease anxiety producing self talk such as thinking of the worse possible life outcome Increase assertiveness with his communication to family members to elicit more help with day-to-day chores and tasks as needed   Assessment of progress:  progressing   This record has been created using [employer].  Chart creation errors have been sought, but may not always have been located and corrected. Such creation errors do not reflect on the standard of medical care.    Waldron Session, [facility]

## 2021-08-30 DIAGNOSIS — Z23 Encounter for immunization: Secondary | ICD-10-CM | POA: Diagnosis not present

## 2021-08-30 DIAGNOSIS — R21 Rash and other nonspecific skin eruption: Secondary | ICD-10-CM | POA: Diagnosis not present

## 2021-08-30 DIAGNOSIS — J310 Chronic rhinitis: Secondary | ICD-10-CM | POA: Diagnosis not present

## 2021-08-30 DIAGNOSIS — J4530 Mild persistent asthma, uncomplicated: Secondary | ICD-10-CM | POA: Diagnosis not present

## 2021-08-31 ENCOUNTER — Other Ambulatory Visit: Payer: Self-pay

## 2021-08-31 ENCOUNTER — Other Ambulatory Visit (HOSPITAL_COMMUNITY): Payer: Self-pay

## 2021-08-31 ENCOUNTER — Ambulatory Visit: Payer: 59 | Admitting: Mental Health

## 2021-08-31 DIAGNOSIS — F411 Generalized anxiety disorder: Secondary | ICD-10-CM

## 2021-08-31 MED ORDER — ALBUTEROL SULFATE HFA 108 (90 BASE) MCG/ACT IN AERS
2.0000 | INHALATION_SPRAY | RESPIRATORY_TRACT | 0 refills | Status: DC | PRN
  Filled 2021-08-31: qty 18, 17d supply, fill #0

## 2021-08-31 MED ORDER — QVAR REDIHALER 80 MCG/ACT IN AERB
2.0000 | INHALATION_SPRAY | Freq: Two times a day (BID) | RESPIRATORY_TRACT | 5 refills | Status: DC
  Filled 2021-08-31: qty 10.6, 30d supply, fill #0

## 2021-08-31 MED ORDER — FLUTICASONE PROPIONATE HFA 110 MCG/ACT IN AERO
2.0000 | INHALATION_SPRAY | Freq: Two times a day (BID) | RESPIRATORY_TRACT | 5 refills | Status: DC
  Filled 2021-08-31: qty 12, 30d supply, fill #0

## 2021-08-31 MED ORDER — FLUTICASONE PROPIONATE 50 MCG/ACT NA SUSP
1.0000 | Freq: Every day | NASAL | 5 refills | Status: DC
  Filled 2021-08-31: qty 16, 60d supply, fill #0
  Filled 2022-08-01: qty 16, 60d supply, fill #1

## 2021-08-31 NOTE — Progress Notes (Signed)
[facility] psychotherapy Note  Name: David Sweeney Date: 08/31/2021 MRN: [id number] DOB: 03-May-2001 PCP: Pcp, No  Time spent: 56 minutes  Treatment:  Individual therapy  Mental Status Exam:    Appearance:    Casual     Behavior:   Appropriate  Motor:   WNL  Speech/Language:    Clear and Coherent  Affect:   Full range   Mood:   Euthymic  Thought process:   normal  Thought content:     WNL  Sensory/Perceptual disturbances:     none  Orientation:   x4  Attention:   Good  Concentration:   Good  Memory:   Intact  Fund of knowledge:    Consistent with age and development  Insight:     Good  Judgment:    Good  Impulse Control:   Good     Reported Symptoms:  anxiety, neg self talk, stress-eating, "overly self conscious".  Risk Assessment: Danger to Self:  No Self-injurious Behavior: No Danger to Others: No Duty to Warn:no Physical Aggression / Violence:No  Access to Firearms a concern: No  Gang Involvement:No  Patient / guardian was educated about steps to take if suicide or homicide risk level increases between visits: yes While future psychiatric events cannot be accurately predicted, the patient does not currently require acute inpatient psychiatric care and does not currently meet [facility] involuntary commitment criteria.     Medications: Current Outpatient Medications  Medication Sig Dispense Refill   albuterol (VENTOLIN HFA) 108 (90 Base) MCG/ACT inhaler INHALE 2 PUFFS BY MOUTH EVERY 4 TO 6 HOURS AS NEEDED FOR COUGH AND WHEEZE 18 g 0   albuterol (VENTOLIN HFA) 108 (90 Base) MCG/ACT inhaler Inhale 2 puffs into the lungs every 4-6 hours as needed for cough/whezze. 18 g 0   beclomethasone (QVAR REDIHALER) 80 MCG/ACT inhaler Inhale 2 puffs into the lungs 2 (two) times daily. 10.6 g 5   brexpiprazole (REXULTI) 2 MG TABS tablet Take 1 tablet (2 mg total) by mouth daily. 30 tablet 2   fluticasone (FLONASE) 50 MCG/ACT nasal spray Place 1 spray into both  nostrils daily.     fluticasone (FLONASE) 50 MCG/ACT nasal spray PLACE 1 SPRAY INTO EACH NOSTRIL ONCE DAILY. 16 g 5   fluticasone (FLONASE) 50 MCG/ACT nasal spray Place 1 spray into both nostrils daily. 16 g 5   fluticasone (FLOVENT HFA) 110 MCG/ACT inhaler INHALE 2 PUFFS BY MOUTH INTO THE LUNGS TWICE A DAY 12 g 5   fluticasone (FLOVENT HFA) 110 MCG/ACT inhaler Inhale 2 puffs into the lungs 2 (two) times daily. 12 g 5   QVAR REDIHALER 40 MCG/ACT inhaler      sertraline (ZOLOFT) 100 MG tablet Take 1 tablet (100 mg total) by mouth daily. 90 tablet 0   No current facility-administered medications for this visit.   Subjective:  Patient presents for session on time.  He shared recent progress.  He stated that his grandmother has been diagnosed with stage IV cancer.  He stated that in an effort to cope, he engaged in some journaling in the form of poetry.  He said he also started to play his guitar again and feels this is been a good outlet.  He said his grandmother moved here from [place] a few months ago to assist the family in caring for his mother who is continuing to recover from her own medical issues.  Patient shared how he is close with his grandmother, plans to be there for  her which may delay his getting a job in the next month or 2 as discussed last session.  Through motivational interviewing, patient identified the need to care for himself by exercise and improving his diet.  Some ways he plans to follow through between sessions were identified.    Intervention: CBT, supportive therapy, motivational interviewing    Diagnoses:    ICD-10-CM   1. GAD (generalized anxiety disorder)  F41.1           Plan: Patient is to use CBT, mindfulness and coping skills to help manage stress,, and identify outlets for stress management, relaxation.     Long-term goal:   Reduce overall level, frequency, and intensity of the feelings of emotional distress that inadvertently affects patient's  mood, functioning and relationships for at least 3 consecutive months oer patient report.    Short-term goal:  Decrease anxiety producing self talk such as thinking of the worse possible life outcome Increase assertiveness with his communication to family members to elicit more help with day-to-day chores and tasks as needed   Assessment of progress:  progressing   This record has been created using [employer].  Chart creation errors have been sought, but may not always have been located and corrected. Such creation errors do not reflect on the standard of medical care.    Waldron Session, [facility]

## 2021-09-05 ENCOUNTER — Ambulatory Visit: Payer: 59 | Admitting: Psychiatry

## 2021-09-05 ENCOUNTER — Other Ambulatory Visit (HOSPITAL_COMMUNITY): Payer: Self-pay

## 2021-09-05 ENCOUNTER — Other Ambulatory Visit: Payer: Self-pay

## 2021-09-05 ENCOUNTER — Encounter: Payer: Self-pay | Admitting: Psychiatry

## 2021-09-05 DIAGNOSIS — F411 Generalized anxiety disorder: Secondary | ICD-10-CM | POA: Diagnosis not present

## 2021-09-05 DIAGNOSIS — F331 Major depressive disorder, recurrent, moderate: Secondary | ICD-10-CM | POA: Diagnosis not present

## 2021-09-05 DIAGNOSIS — F422 Mixed obsessional thoughts and acts: Secondary | ICD-10-CM

## 2021-09-05 MED ORDER — SERTRALINE HCL 100 MG PO TABS
100.0000 mg | ORAL_TABLET | Freq: Every day | ORAL | 0 refills | Status: DC
Start: 2021-09-05 — End: 2021-12-08
  Filled 2021-09-05: qty 90, 90d supply, fill #0

## 2021-09-05 NOTE — Progress Notes (Signed)
[name] [id number] 05-19-2001 20 y.o.  Subjective:   Patient ID:  David Sweeney is a 20 y.o. (DOB 30-Nov-2000) male.  Chief Complaint:  Chief Complaint  Patient presents with   Follow-up    Depression and anxiety     HPI David Sweeney presents to the office today for follow-up of anxiety and depression. He reports, "I think the current meds I am on are pretty good." He asks about coming off of Rexulti.   He reports "occ bouts of anger." He reports that he was previously taking Rexulti intermittently and has been taking it consistently recently.   He reports that recently he has been in a better mood. Has been playing guitar and doing voice work. He reports improved energy, motivation, and interest in things. He reports that he will occasionally lose focus. He reports some recent weight gain. Sleeping well. He reports occasional intrusive thoughts of suicide without intent. Denies SI.   He reports that anxiety has been "doing alright. Haven't really noticed being super anxious" unless there is severe stress.   Mother is completing treatments. Grandmother may be about to restart chemotherapy.   Has been seeing Elio Forget, [facility].   Past Psychiatric Medication Trials: Clomipramine- helpful for OCD. Caused worsening depression. Lexapro Luvox Zoloft Effexor XR Rexulti Saphris Klonopin  AIMS    Flowsheet Row Office Visit from 09/05/2021 in [facility] Office Visit from 11/08/2020 in [facility]  AIMS Total Score 0 0      PHQ2-9    Flowsheet Row Office Visit from 10/13/2019 in [facility] Visit from 09/29/2019 in [facility] at [facility] Total Score 2 3  PHQ-9 Total Score 16 16        Review of Systems:  Review of Systems  Musculoskeletal:  Negative for gait problem.  Neurological:  Negative for tremors.  Psychiatric/Behavioral:         Please refer to HPI   Medications:  I have reviewed the patient's current medications.  Current Outpatient Medications  Medication Sig Dispense Refill   albuterol (VENTOLIN HFA) 108 (90 Base) MCG/ACT inhaler Inhale 2 puffs into the lungs every 4-6 hours as needed for cough/whezze. 18 g 0   cetirizine (ZYRTEC) 10 MG tablet Take 10 mg by mouth daily.     fluticasone (FLONASE) 50 MCG/ACT nasal spray Place 1 spray into both nostrils daily.     fluticasone (FLONASE) 50 MCG/ACT nasal spray Place 1 spray into both nostrils daily. 16 g 5   fluticasone (FLOVENT HFA) 110 MCG/ACT inhaler Inhale 2 puffs into the lungs 2 (two) times daily. 12 g 5   albuterol (VENTOLIN HFA) 108 (90 Base) MCG/ACT inhaler INHALE 2 PUFFS BY MOUTH EVERY 4 TO 6 HOURS AS NEEDED FOR COUGH AND WHEEZE 18 g 0   beclomethasone (QVAR REDIHALER) 80 MCG/ACT inhaler Inhale 2 puffs into the lungs 2 (two) times daily. (Patient not taking: Reported on 09/05/2021) 10.6 g 5   fluticasone (FLONASE) 50 MCG/ACT nasal spray PLACE 1 SPRAY INTO EACH NOSTRIL ONCE DAILY. 16 g 5   fluticasone (FLOVENT HFA) 110 MCG/ACT inhaler INHALE 2 PUFFS BY MOUTH INTO THE LUNGS TWICE A DAY 12 g 5   sertraline (ZOLOFT) 100 MG tablet Take 1 tablet (100 mg total) by mouth daily. 90 tablet 0   No current facility-administered medications for this visit.    Medication Side Effects: Other: Sexual side effects  Allergies: No Known Allergies  Past Medical History:  Diagnosis Date   Asthma     Past Medical History, Surgical history, Social history, and Family history were reviewed and updated as appropriate.   Please see review of systems for further details on the patient's review from today.   Objective:   Physical Exam:  There were no vitals taken for this visit.  Physical Exam Constitutional:      General: He is not in acute distress. Musculoskeletal:        General: No deformity.  Neurological:     Mental Status: He is alert and oriented to person, place, and time.     Coordination:  Coordination normal.  Psychiatric:        Attention and Perception: Attention and perception normal. He does not perceive auditory or visual hallucinations.        Mood and Affect: Mood normal. Mood is not anxious or depressed. Affect is not labile, blunt, angry or inappropriate.        Speech: Speech normal.        Behavior: Behavior normal.        Thought Content: Thought content normal. Thought content is not paranoid or delusional. Thought content does not include homicidal or suicidal ideation. Thought content does not include homicidal or suicidal plan.        Cognition and Memory: Cognition and memory normal.        Judgment: Judgment normal.     Comments: Insight intact    Lab Review:  No results found for: NA, K, CL, CO2, GLUCOSE, BUN, CREATININE, CALCIUM, PROT, ALBUMIN, AST, ALT, ALKPHOS, BILITOT, GFRNONAA, GFRAA  No results found for: WBC, RBC, HGB, HCT, PLT, MCV, MCH, MCHC, RDW, LYMPHSABS, MONOABS, EOSABS, BASOSABS  No results found for: POCLITH, LITHIUM   No results found for: PHENYTOIN, PHENOBARB, VALPROATE, CBMZ   .res Assessment: Plan:   Will discontinue Rexulti since pt reports that he may have started to have episodes of increased anger since starting Rexulti.  Continue Sertraline 100 mg po qd for anxiety and depression.  Recommend continuing psychotherapy with Elio Forget, [facility].  Pt to follow-up with this provider in 2 months or sooner if clinically indicated.  Patient advised to contact office with any questions, adverse effects, or acute worsening in signs and symptoms.    David Sweeney was seen today for follow-up.  Diagnoses and all orders for this visit:  Mixed obsessional thoughts and acts -     sertraline (ZOLOFT) 100 MG tablet; Take 1 tablet (100 mg total) by mouth daily.  GAD (generalized anxiety disorder) -     sertraline (ZOLOFT) 100 MG tablet; Take 1 tablet (100 mg total) by mouth daily.  Moderate recurrent major depression (HCC) -     sertraline  (ZOLOFT) 100 MG tablet; Take 1 tablet (100 mg total) by mouth daily.    Please see After Visit Summary for patient specific instructions.  Future Appointments  Date Time Provider Department Center  09/19/2021  2:00 PM Waldron Session, [facility] CP-CP None  10/04/2021  1:00 PM Waldron Session, [facility] CP-CP None  12/06/2021 12:45 PM Corie Chiquito, PMHNP CP-CP None    No orders of the defined types were placed in this encounter.   -------------------------------

## 2021-09-19 ENCOUNTER — Ambulatory Visit: Payer: 59 | Admitting: Mental Health

## 2021-09-19 ENCOUNTER — Other Ambulatory Visit: Payer: Self-pay

## 2021-09-19 DIAGNOSIS — F422 Mixed obsessional thoughts and acts: Secondary | ICD-10-CM | POA: Diagnosis not present

## 2021-09-19 NOTE — Progress Notes (Signed)
[facility] psychotherapy Note  Name: David Sweeney Date: 09/19/2021 MRN: [id number] DOB: 2001-02-09 PCP: Pcp, No  Time spent: 51 minutes  Treatment:  Individual therapy  Mental Status Exam:    Appearance:    Casual     Behavior:   Appropriate  Motor:   WNL  Speech/Language:    Clear and Coherent  Affect:   Full range   Mood:   Euthymic  Thought process:   normal  Thought content:     WNL  Sensory/Perceptual disturbances:     none  Orientation:   x4  Attention:   Good  Concentration:   Good  Memory:   Intact  Fund of knowledge:    Consistent with age and development  Insight:     Good  Judgment:    Good  Impulse Control:   Good     Reported Symptoms:  anxiety, neg self talk, stress-eating, "overly self conscious".  Risk Assessment: Danger to Self:  No Self-injurious Behavior: No Danger to Others: No Duty to Warn:no Physical Aggression / Violence:No  Access to Firearms a concern: No  Gang Involvement:No  Patient / guardian was educated about steps to take if suicide or homicide risk level increases between visits: yes While future psychiatric events cannot be accurately predicted, the patient does not currently require acute inpatient psychiatric care and does not currently meet [facility] involuntary commitment criteria.     Medications: Current Outpatient Medications  Medication Sig Dispense Refill   albuterol (VENTOLIN HFA) 108 (90 Base) MCG/ACT inhaler INHALE 2 PUFFS BY MOUTH EVERY 4 TO 6 HOURS AS NEEDED FOR COUGH AND WHEEZE 18 g 0   albuterol (VENTOLIN HFA) 108 (90 Base) MCG/ACT inhaler Inhale 2 puffs into the lungs every 4-6 hours as needed for cough/whezze. 18 g 0   beclomethasone (QVAR REDIHALER) 80 MCG/ACT inhaler Inhale 2 puffs into the lungs 2 (two) times daily. (Patient not taking: Reported on 09/05/2021) 10.6 g 5   cetirizine (ZYRTEC) 10 MG tablet Take 10 mg by mouth daily.     fluticasone (FLONASE) 50 MCG/ACT nasal spray Place 1 spray  into both nostrils daily.     fluticasone (FLONASE) 50 MCG/ACT nasal spray PLACE 1 SPRAY INTO EACH NOSTRIL ONCE DAILY. 16 g 5   fluticasone (FLONASE) 50 MCG/ACT nasal spray Place 1 spray into both nostrils daily. 16 g 5   fluticasone (FLOVENT HFA) 110 MCG/ACT inhaler INHALE 2 PUFFS BY MOUTH INTO THE LUNGS TWICE A DAY 12 g 5   fluticasone (FLOVENT HFA) 110 MCG/ACT inhaler Inhale 2 puffs into the lungs 2 (two) times daily. 12 g 5   sertraline (ZOLOFT) 100 MG tablet Take 1 tablet (100 mg total) by mouth daily. 90 tablet 0   No current facility-administered medications for this visit.   Subjective:  Patient presents for session on time.  He shared how his grandmother's results came back and she is able to receive treatment.  She is receiving chemotherapy for stage IV cancer. He reports feeling more depressed recently, denies it being severe but reports less motivation, more depressed mood.  Utilizing motivational interviewing, patient identified needs related to potential changes in his relationship with his partner.  He went on to share details, how he is not building a relationship due to the varying factors.  He stated that he cares about him however, given his not having many goals or motivation, as well as staying up late and not being able to spend time together as factors for his  essentially feeling less engaged to continue the relationship.  At this point, he identified the need to discuss these needs more adequately with his partner and hopes that their relationship can thrive.  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   Intervention: CBT, supportive therapy, motivational interviewing    Diagnoses:    ICD-10-CM   1. Mixed obsessional thoughts and acts  F42.2            Plan: Patient is to use CBT, mindfulness and coping skills to help manage stress,, and identify  outlets for stress management, relaxation.     Long-term goal:   Reduce overall level, frequency, and intensity of the feelings of emotional distress that inadvertently affects patient's mood, functioning and relationships for at least 3 consecutive months oer patient report.    Short-term goal:  Decrease anxiety producing self talk such as thinking of the worse possible life outcome Increase assertiveness with his communication to family members to elicit more help with day-to-day chores and tasks as needed   Assessment of progress:  progressing   This record has been created using [employer].  Chart creation errors have been sought, but may not always have been located and corrected. Such creation errors do not reflect on the standard of medical care.    Waldron Session, [facility]

## 2021-10-04 ENCOUNTER — Ambulatory Visit (INDEPENDENT_AMBULATORY_CARE_PROVIDER_SITE_OTHER): Payer: 59 | Admitting: Mental Health

## 2021-10-04 DIAGNOSIS — F422 Mixed obsessional thoughts and acts: Secondary | ICD-10-CM | POA: Diagnosis not present

## 2021-10-04 NOTE — Progress Notes (Signed)
[facility] psychotherapy Note  Name: David Sweeney Date: 10/04/2021 MRN: [id number] DOB: Feb 25, 2001 PCP: Pcp, No  Time spent: 50 minutes  Treatment:  Individual therapy  Virtual Visit via Telehealth Note Connected with patient by via telephone with their informed consent, and verified patient privacy and that I am speaking with the correct person using two identifiers. I discussed the limitations, risks, security and privacy concerns of performing psychotherapy and the availability of in person appointments. I also discussed with the patient that there may be a patient responsible charge related to this service. The patient expressed understanding and agreed to proceed. I discussed the treatment planning with the patient. The patient was provided an opportunity to ask questions and all were answered. The patient agreed with the plan and demonstrated an understanding of the instructions. The patient was advised to call  our office if  symptoms worsen or feel they are in a crisis state and need immediate contact.   Therapist Location: office Patient Location: home    Mental Status Exam:    Appearance:    Casual     Behavior:   Appropriate  Motor:   WNL  Speech/Language:    Clear and Coherent  Affect:   Full range   Mood:   Euthymic  Thought process:   normal  Thought content:     WNL  Sensory/Perceptual disturbances:     none  Orientation:   x4  Attention:   Good  Concentration:   Good  Memory:   Intact  Fund of knowledge:    Consistent with age and development  Insight:     Good  Judgment:    Good  Impulse Control:   Good     Reported Symptoms:  anxiety, neg self talk, stress-eating, "overly self conscious".  Risk Assessment: Danger to Self:  No Self-injurious Behavior: No Danger to Others: No Duty to Warn:no Physical Aggression / Violence:No  Access to Firearms a concern: No  Gang Involvement:No  Patient / guardian was educated about steps to take if suicide  or homicide risk level increases between visits: yes While future psychiatric events cannot be accurately predicted, the patient does not currently require acute inpatient psychiatric care and does not currently meet [facility] involuntary commitment criteria.     Medications: Current Outpatient Medications  Medication Sig Dispense Refill   albuterol (VENTOLIN HFA) 108 (90 Base) MCG/ACT inhaler INHALE 2 PUFFS BY MOUTH EVERY 4 TO 6 HOURS AS NEEDED FOR COUGH AND WHEEZE 18 g 0   albuterol (VENTOLIN HFA) 108 (90 Base) MCG/ACT inhaler Inhale 2 puffs into the lungs every 4-6 hours as needed for cough/whezze. 18 g 0   beclomethasone (QVAR REDIHALER) 80 MCG/ACT inhaler Inhale 2 puffs into the lungs 2 (two) times daily. (Patient not taking: Reported on 09/05/2021) 10.6 g 5   cetirizine (ZYRTEC) 10 MG tablet Take 10 mg by mouth daily.     fluticasone (FLONASE) 50 MCG/ACT nasal spray Place 1 spray into both nostrils daily.     fluticasone (FLONASE) 50 MCG/ACT nasal spray PLACE 1 SPRAY INTO EACH NOSTRIL ONCE DAILY. 16 g 5   fluticasone (FLONASE) 50 MCG/ACT nasal spray Place 1 spray into both nostrils daily. 16 g 5   fluticasone (FLOVENT HFA) 110 MCG/ACT inhaler INHALE 2 PUFFS BY MOUTH INTO THE LUNGS TWICE A DAY 12 g 5   fluticasone (FLOVENT HFA) 110 MCG/ACT inhaler Inhale 2 puffs into the lungs 2 (two) times daily. 12 g 5   sertraline (ZOLOFT) 100  MG tablet Take 1 tablet (100 mg total) by mouth daily. 90 tablet 0   No current facility-administered medications for this visit.   Subjective:  Patient engaged in telehealth session via phone.  He stated he feels he has been more stressed recently. He stated he broke up w/his boyfriend about 2 weeks ago. He stated he is trying to be supportive to his ex-boyfriend during this time, which has been challenging. His grM is wanting to move back to [place]; he feels she has a lot of manic episode and this was corroborated by his family, his mother, aunt. He reports he  has been more depressed, scrolling media online. He identified struggling to identify unmet needs. He stated he wants to find employment sometime early next year. 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  Facilitated his identifying during today needs, ways to increase motivation where he plans to continue to focus on eating healthy as he has lost some weight over the past 1 to 2 months, approximately 20 pounds.  He shared also that he has not been exercising and that he may engage in some walking exercise to cope and care for himself.   Intervention: CBT, supportive therapy, motivational interviewing   Diagnoses:    ICD-10-CM   1. Mixed obsessional thoughts and acts  F42.2        Plan: Patient is to use CBT, mindfulness and coping skills to help manage stress,, and identify outlets for stress management, relaxation.     Long-term goal:   Reduce overall level, frequency, and intensity of the feelings of emotional distress that inadvertently affects patient's mood, functioning and relationships for at least 3 consecutive months oer patient report.    Short-term goal:  Decrease anxiety producing self talk such as thinking of the worse possible life outcome Increase assertiveness with his communication to family members to elicit more help with day-to-day chores and tasks as needed   Assessment of progress:  progressing   This record has been created using [employer].  Chart creation errors have been sought, but may not always have been located and corrected. Such creation errors do not reflect on the standard of medical care.    Waldron Session, [facility]

## 2021-10-26 DIAGNOSIS — Z0100 Encounter for examination of eyes and vision without abnormal findings: Secondary | ICD-10-CM | POA: Diagnosis not present

## 2021-11-07 ENCOUNTER — Ambulatory Visit: Payer: 59 | Admitting: Mental Health

## 2021-11-07 ENCOUNTER — Other Ambulatory Visit: Payer: Self-pay

## 2021-11-07 DIAGNOSIS — F331 Major depressive disorder, recurrent, moderate: Secondary | ICD-10-CM

## 2021-11-07 NOTE — Progress Notes (Signed)
[facility] psychotherapy Note  Name: David Sweeney Date: 11/07/2021 MRN: [id number] DOB: 2001-10-22 PCP: Pcp, No  Time spent: 49 minutes  Treatment:  Individual therapy  Mental Status Exam:    Appearance:    Casual     Behavior:   Appropriate  Motor:   WNL  Speech/Language:    Clear and Coherent  Affect:   Full range   Mood:   Euthymic  Thought process:   normal  Thought content:     WNL  Sensory/Perceptual disturbances:     none  Orientation:   x4  Attention:   Good  Concentration:   Good  Memory:   Intact  Fund of knowledge:    Consistent with age and development  Insight:     Good  Judgment:    Good  Impulse Control:   Good     Reported Symptoms:  anxiety, neg self talk, stress-eating, "overly self conscious".  Risk Assessment: Danger to Self:  No Self-injurious Behavior: No Danger to Others: No Duty to Warn:no Physical Aggression / Violence:No  Access to Firearms a concern: No  Gang Involvement:No  Patient / guardian was educated about steps to take if suicide or homicide risk level increases between visits: yes While future psychiatric events cannot be accurately predicted, the patient does not currently require acute inpatient psychiatric care and does not currently meet [facility] involuntary commitment criteria.     Medications: Current Outpatient Medications  Medication Sig Dispense Refill   albuterol (VENTOLIN HFA) 108 (90 Base) MCG/ACT inhaler INHALE 2 PUFFS BY MOUTH EVERY 4 TO 6 HOURS AS NEEDED FOR COUGH AND WHEEZE 18 g 0   albuterol (VENTOLIN HFA) 108 (90 Base) MCG/ACT inhaler Inhale 2 puffs into the lungs every 4-6 hours as needed for cough/whezze. 18 g 0   beclomethasone (QVAR REDIHALER) 80 MCG/ACT inhaler Inhale 2 puffs into the lungs 2 (two) times daily. (Patient not taking: Reported on 09/05/2021) 10.6 g 5   cetirizine (ZYRTEC) 10 MG tablet Take 10 mg by mouth daily.     fluticasone (FLONASE) 50 MCG/ACT nasal spray Place 1 spray into  both nostrils daily.     fluticasone (FLONASE) 50 MCG/ACT nasal spray PLACE 1 SPRAY INTO EACH NOSTRIL ONCE DAILY. 16 g 5   fluticasone (FLONASE) 50 MCG/ACT nasal spray Place 1 spray into both nostrils daily. 16 g 5   fluticasone (FLOVENT HFA) 110 MCG/ACT inhaler INHALE 2 PUFFS BY MOUTH INTO THE LUNGS TWICE A DAY 12 g 5   fluticasone (FLOVENT HFA) 110 MCG/ACT inhaler Inhale 2 puffs into the lungs 2 (two) times daily. 12 g 5   sertraline (ZOLOFT) 100 MG tablet Take 1 tablet (100 mg total) by mouth daily. 90 tablet 0   No current facility-administered medications for this visit.   Subjective:  Patient presents for assistance for session on time.  Assess progress, recent events where he shared how he has been feeling depressed since the end of November.  He made the realization in session that it could possibly could possibly be due to his discontinuing his last medication and starting another around this time.  He plans to further discuss at his next medication management appointment with Corie Chiquito, NP.  Facilitated his identifying needs and where he seems where he stated that he needs to look for employment.  He stated he stated that his family has been dealing with financial dealing with financial stress dealing with financial stress and he could help contribute by finding a job  by finding a job, also by finding a job, also, that that he has had more time on his hands recently, feeling that by playing video to an excess.  He had difficulty identifying other current factors other than his historically feeling more depressed this time of year in the past.  Again, he did not identify specific factors but agreed that having less daylight, colder temperatures may affect his motivation in some ways.  He plans to follow through by finding at least possibility potential job possibilities by our next session and setting daily achievable achievable goals achievable goals toward completing tasks  daily.   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     Long-term goal:   Reduce overall level, frequency, and intensity of the feelings of emotional distress that inadvertently affects patient's mood, functioning and relationships for at least 3 consecutive months oer patient report.    Short-term goal:  Decrease anxiety producing self talk such as thinking of the worse possible life outcome Increase assertiveness with his communication to family members to elicit more help with day-to-day chores and tasks as needed   Assessment of progress:  progressing   This record has been created using [employer].  Chart creation errors have been sought, but may not always have been located and corrected. Such creation errors do not reflect on the standard of medical care.    Waldron Session, [facility]

## 2021-11-28 ENCOUNTER — Ambulatory Visit (INDEPENDENT_AMBULATORY_CARE_PROVIDER_SITE_OTHER): Payer: 59 | Admitting: Mental Health

## 2021-11-28 DIAGNOSIS — F331 Major depressive disorder, recurrent, moderate: Secondary | ICD-10-CM

## 2021-11-28 NOTE — Progress Notes (Signed)
[facility] psychotherapy Note  Name: David Sweeney Date: 11/28/2021 MRN: [id number] DOB: 2001-01-06 PCP: Pcp, No  Time spent: 49 minutes  Treatment:  Individual therapy  Mental Status Exam:    Appearance:    Casual     Behavior:   Appropriate  Motor:   WNL  Speech/Language:    Clear and Coherent  Affect:   Full range   Mood:   Euthymic  Thought process:   normal  Thought content:     WNL  Sensory/Perceptual disturbances:     none  Orientation:   x4  Attention:   Good  Concentration:   Good  Memory:   Intact  Fund of knowledge:    Consistent with age and development  Insight:     Good  Judgment:    Good  Impulse Control:   Good     Reported Symptoms:  anxiety, neg self talk, stress-eating, "overly self conscious".  Risk Assessment: Danger to Self:  No Self-injurious Behavior: No Danger to Others: No Duty to Warn:no Physical Aggression / Violence:No  Access to Firearms a concern: No  Gang Involvement:No  Patient / guardian was educated about steps to take if suicide or homicide risk level increases between visits: yes While future psychiatric events cannot be accurately predicted, the patient does not currently require acute inpatient psychiatric care and does not currently meet [facility] involuntary commitment criteria.     Medications: Current Outpatient Medications  Medication Sig Dispense Refill   albuterol (VENTOLIN HFA) 108 (90 Base) MCG/ACT inhaler INHALE 2 PUFFS BY MOUTH EVERY 4 TO 6 HOURS AS NEEDED FOR COUGH AND WHEEZE 18 g 0   albuterol (VENTOLIN HFA) 108 (90 Base) MCG/ACT inhaler Inhale 2 puffs into the lungs every 4-6 hours as needed for cough/whezze. 18 g 0   beclomethasone (QVAR REDIHALER) 80 MCG/ACT inhaler Inhale 2 puffs into the lungs 2 (two) times daily. (Patient not taking: Reported on 09/05/2021) 10.6 g 5   cetirizine (ZYRTEC) 10 MG tablet Take 10 mg by mouth daily.     fluticasone (FLONASE) 50 MCG/ACT nasal spray Place 1 spray into  both nostrils daily.     fluticasone (FLONASE) 50 MCG/ACT nasal spray PLACE 1 SPRAY INTO EACH NOSTRIL ONCE DAILY. 16 g 5   fluticasone (FLONASE) 50 MCG/ACT nasal spray Place 1 spray into both nostrils daily. 16 g 5   fluticasone (FLOVENT HFA) 110 MCG/ACT inhaler INHALE 2 PUFFS BY MOUTH INTO THE LUNGS TWICE A DAY 12 g 5   fluticasone (FLOVENT HFA) 110 MCG/ACT inhaler Inhale 2 puffs into the lungs 2 (two) times daily. 12 g 5   sertraline (ZOLOFT) 100 MG tablet Take 1 tablet (100 mg total) by mouth daily. 90 tablet 0   No current facility-administered medications for this visit.   Subjective:  Patient engaged in session via telephone. Reports feeling passive SI due to "feeling like a burden". He denies anyone has done anything to make him feel this way, however, his aunt is visiting and has reacted exasperated towards him. An example is his forgetting to clean something, and letting his aunt know when she asks.  Another issue is his mother telling him he is not looking for a job.  He is trying to find motivation to look for a job. He really does not want to interact w/ anyone other than his ex boyfriend at times, he feels more comfortable being at home. Discussed work from home jobs. He has some desire to return to college but  struggles w/ motivation. He feels he is also coping w/ seasonal depression, which has added to his feeling depressed. He identified the need to look for jobs, possibly online work today and over the next few days.    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     Long-term goal:   Reduce overall level, frequency, and intensity of the feelings of emotional distress that inadvertently affects patient's mood, functioning and  relationships for at least 3 consecutive months oer patient report.    Short-term goal:  Decrease anxiety producing self talk such as thinking of the worse possible life outcome Increase assertiveness with his communication to family members to elicit more help with day-to-day chores and tasks as needed   Assessment of progress:  progressing   This record has been created using [employer].  Chart creation errors have been sought, but may not always have been located and corrected. Such creation errors do not reflect on the standard of medical care.    Waldron Session, [facility]

## 2021-12-06 ENCOUNTER — Ambulatory Visit: Payer: 59 | Admitting: Psychiatry

## 2021-12-08 ENCOUNTER — Other Ambulatory Visit (HOSPITAL_COMMUNITY): Payer: Self-pay

## 2021-12-08 ENCOUNTER — Other Ambulatory Visit: Payer: Self-pay | Admitting: Psychiatry

## 2021-12-08 DIAGNOSIS — F411 Generalized anxiety disorder: Secondary | ICD-10-CM

## 2021-12-08 DIAGNOSIS — F331 Major depressive disorder, recurrent, moderate: Secondary | ICD-10-CM

## 2021-12-08 DIAGNOSIS — F422 Mixed obsessional thoughts and acts: Secondary | ICD-10-CM

## 2021-12-11 ENCOUNTER — Other Ambulatory Visit (HOSPITAL_COMMUNITY): Payer: Self-pay

## 2021-12-11 ENCOUNTER — Ambulatory Visit: Payer: 59 | Admitting: Mental Health

## 2021-12-11 MED ORDER — SERTRALINE HCL 100 MG PO TABS
100.0000 mg | ORAL_TABLET | Freq: Every day | ORAL | 0 refills | Status: DC
  Filled 2021-12-11: qty 90, 90d supply, fill #0

## 2021-12-26 ENCOUNTER — Other Ambulatory Visit: Payer: Self-pay

## 2021-12-26 ENCOUNTER — Ambulatory Visit (INDEPENDENT_AMBULATORY_CARE_PROVIDER_SITE_OTHER): Payer: 59 | Admitting: Mental Health

## 2021-12-26 DIAGNOSIS — F422 Mixed obsessional thoughts and acts: Secondary | ICD-10-CM

## 2021-12-26 NOTE — Progress Notes (Signed)
[facility] psychotherapy Note  Name: DESIREE ESTABROOKS Date: 12/26/2021 MRN: [id number] DOB: 04-03-2001 PCP: Pcp, No  Time spent: 51 minutes  Treatment:  Individual therapy  Mental Status Exam:    Appearance:    Casual     Behavior:   Appropriate  Motor:   WNL  Speech/Language:    Clear and Coherent  Affect:   Full range   Mood:   Euthymic  Thought process:   normal  Thought content:     WNL  Sensory/Perceptual disturbances:     none  Orientation:   x4  Attention:   Good  Concentration:   Good  Memory:   Intact  Fund of knowledge:    Consistent with age and development  Insight:     Good  Judgment:    Good  Impulse Control:   Good     Reported Symptoms:  anxiety, neg self talk, stress-eating, "overly self conscious".  Risk Assessment: Danger to Self:  No Self-injurious Behavior: No Danger to Others: No Duty to Warn:no Physical Aggression / Violence:No  Access to Firearms a concern: No  Gang Involvement:No  Patient / guardian was educated about steps to take if suicide or homicide risk level increases between visits: yes While future psychiatric events cannot be accurately predicted, the patient does not currently require acute inpatient psychiatric care and does not currently meet [facility] involuntary commitment criteria.     Medications: Current Outpatient Medications  Medication Sig Dispense Refill   albuterol (VENTOLIN HFA) 108 (90 Base) MCG/ACT inhaler INHALE 2 PUFFS BY MOUTH EVERY 4 TO 6 HOURS AS NEEDED FOR COUGH AND WHEEZE 18 g 0   albuterol (VENTOLIN HFA) 108 (90 Base) MCG/ACT inhaler Inhale 2 puffs into the lungs every 4-6 hours as needed for cough/whezze. 18 g 0   beclomethasone (QVAR REDIHALER) 80 MCG/ACT inhaler Inhale 2 puffs into the lungs 2 (two) times daily. (Patient not taking: Reported on 09/05/2021) 10.6 g 5   cetirizine (ZYRTEC) 10 MG tablet Take 10 mg by mouth daily.     fluticasone (FLONASE) 50 MCG/ACT nasal spray Place 1 spray into  both nostrils daily.     fluticasone (FLONASE) 50 MCG/ACT nasal spray PLACE 1 SPRAY INTO EACH NOSTRIL ONCE DAILY. 16 g 5   fluticasone (FLONASE) 50 MCG/ACT nasal spray Place 1 spray into both nostrils daily. 16 g 5   fluticasone (FLOVENT HFA) 110 MCG/ACT inhaler INHALE 2 PUFFS BY MOUTH INTO THE LUNGS TWICE A DAY 12 g 5   fluticasone (FLOVENT HFA) 110 MCG/ACT inhaler Inhale 2 puffs into the lungs 2 (two) times daily. 12 g 5   sertraline (ZOLOFT) 100 MG tablet Take 1 tablet (100 mg total) by mouth daily. 90 tablet 0   No current facility-administered medications for this visit.   Subjective:  Patient shared recent progress. He stated that about 2 weeks ago he had a depressive episode that lasted about a week. He said that it was severe at times, increasing SI was experienced. He said that his father reached out to him and was supportive. Patient said that he had discontinued his psychiatric medications a few weeks prior and feels this affected his feeling more depressed as a result. He now it's back on his medication and plans to take it consistently. We discussed setting daily alarms as a reminder. He reported having SI with some vague plans at the time while depressed. Today, he denies SI, denies any planner intent to harm himself. Reviewed how patient has the  option to also reach out to our office between sessions if needed. Facilitated his identifying stressors associated with his depression, patient having difficulty, through guided discovery shared "I felt like I hadn't have purpose, like I don't matter". Patient say that he is trying to look for employment but has not found a job yet. He said he has worked on his sleep schedule and now is awake more often during the day, getting about 8 hours of sleep for night.    Intervention: CBT, supportive therapy, motivational interviewing   Diagnoses:    ICD-10-CM   1. Mixed obsessional thoughts and acts  F42.2         Plan: Patient is to use CBT,  mindfulness and coping skills to help manage stress,, and identify outlets for stress management, relaxation.     Long-term goal:   Reduce overall level, frequency, and intensity of the feelings of emotional distress that inadvertently affects patient's mood, functioning and relationships for at least 3 consecutive months oer patient report.    Short-term goal:  Decrease anxiety producing self talk such as thinking of the worse possible life outcome Increase assertiveness with his communication to family members to elicit more help with day-to-day chores and tasks as needed   Assessment of progress:  progressing   This record has been created using [employer].  Chart creation errors have been sought, but may not always have been located and corrected. Such creation errors do not reflect on the standard of medical care.    Anson Oregon, [facility]

## 2022-01-04 ENCOUNTER — Ambulatory Visit (INDEPENDENT_AMBULATORY_CARE_PROVIDER_SITE_OTHER): Payer: 59 | Admitting: Mental Health

## 2022-01-04 DIAGNOSIS — F422 Mixed obsessional thoughts and acts: Secondary | ICD-10-CM | POA: Diagnosis not present

## 2022-01-04 NOTE — Progress Notes (Signed)
[facility] psychotherapy Note ? ?Name: David Sweeney ?Date: 01/04/2022 ?MRN: [id number] ?DOB: 11/28/2000 ?PCP: Pcp, No ? ?Time spent: 51 minutes ? ?Treatment:  Individual therapy ? ?Virtual Visit via Telehealth Note ?Connected with patient by a telemedicine/telehealth application, with their informed consent, and verified patient privacy and that I am speaking with the correct person using two identifiers. I discussed the limitations, risks, security and privacy concerns of performing psychotherapy and the availability of in person appointments. I also discussed with the patient that there may be a patient responsible charge related to this service. The patient expressed understanding and agreed to proceed. ?I discussed the treatment planning with the patient. The patient was provided an opportunity to ask questions and all were answered. The patient agreed with the plan and demonstrated an understanding of the instructions. The patient was advised to call  our office if  symptoms worsen or feel they are in a crisis state and need immediate contact. ?  ?Therapist Location: office ?Patient Location: home ?  ? ?Mental Status Exam: ?   ?Appearance:    Casual     ?Behavior:   Appropriate  ?Motor:   WNL  ?Speech/Language:    Clear and Coherent  ?Affect:   Full range   ?Mood:   Euthymic  ?Thought process:   normal  ?Thought content:     WNL  ?Sensory/Perceptual disturbances:     none  ?Orientation:   x4  ?Attention:   Good  ?Concentration:   Good  ?Memory:   Intact  ?Fund of knowledge:    Consistent with age and development  ?Insight:     Good  ?Judgment:    Good  ?Impulse Control:   Good  ?  ? ?Reported Symptoms:  anxiety, neg self talk, stress-eating, "overly self conscious". ? ?Risk Assessment: ?Danger to Self:  No ?Self-injurious Behavior: No ?Danger to Others: No ?Duty to Warn:no ?Physical Aggression / Violence:No  ?Access to Firearms a concern: No  ?Gang Involvement:No  ?Patient / guardian was educated about  steps to take if suicide or homicide risk level increases between visits: yes ?While future psychiatric events cannot be accurately predicted, the patient does not currently require acute inpatient psychiatric care and does not currently meet [facility] involuntary commitment criteria. ? ? ? ? ?Medications: ?Current Outpatient Medications  ?Medication Sig Dispense Refill  ? albuterol (VENTOLIN HFA) 108 (90 Base) MCG/ACT inhaler INHALE 2 PUFFS BY MOUTH EVERY 4 TO 6 HOURS AS NEEDED FOR COUGH AND WHEEZE 18 g 0  ? albuterol (VENTOLIN HFA) 108 (90 Base) MCG/ACT inhaler Inhale 2 puffs into the lungs every 4-6 hours as needed for cough/whezze. 18 g 0  ? beclomethasone (QVAR REDIHALER) 80 MCG/ACT inhaler Inhale 2 puffs into the lungs 2 (two) times daily. (Patient not taking: Reported on 09/05/2021) 10.6 g 5  ? cetirizine (ZYRTEC) 10 MG tablet Take 10 mg by mouth daily.    ? fluticasone (FLONASE) 50 MCG/ACT nasal spray Place 1 spray into both nostrils daily.    ? fluticasone (FLONASE) 50 MCG/ACT nasal spray PLACE 1 SPRAY INTO EACH NOSTRIL ONCE DAILY. 16 g 5  ? fluticasone (FLONASE) 50 MCG/ACT nasal spray Place 1 spray into both nostrils daily. 16 g 5  ? fluticasone (FLOVENT HFA) 110 MCG/ACT inhaler INHALE 2 PUFFS BY MOUTH INTO THE LUNGS TWICE A DAY 12 g 5  ? fluticasone (FLOVENT HFA) 110 MCG/ACT inhaler Inhale 2 puffs into the lungs 2 (two) times daily. 12 g 5  ? sertraline (ZOLOFT)  100 MG tablet Take 1 tablet (100 mg total) by mouth daily. 90 tablet 0  ? ?No current facility-administered medications for this visit.  ? ?Subjective:  ?Patient engaged in telehealth session via phone. He reports he has been trying to "self improve", trying orthopedic shoes and trying to improve his posture. He continues to struggle w/ motivation in looking for job. He has tried to be more social mainly online. Continues to game online. Continues to endorse feeling depressed, "feeling guilty for being a burden". He stated his parents told him  that they were one paycheck away from being bankrupt. He stated they have had to endure a lot of medical bills. He stated he told his father about how he was feeling  and he was supportive. Identified needing to "get out of shell and reintegrate into society". A friend reached out recently, and patient feels guilty as he has not reached out to him in a while.  Patient stated that he needs to be more regimented, to set aside some time every day to look for jobs, how this will probably make him feel better even if his not exactly the job that he might want to take.  Provide support and encouragement toward his reminding himself of what he identified in today's session that he is not the cause of all of this family's financial hardships. ? ? ?Intervention: CBT, supportive therapy, motivational interviewing ? ? ?Diagnoses:  ?  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  ? ? ? ? ? ?Plan: Patient is to use CBT, mindfulness and coping skills to help manage stress,, and identify outlets for stress management, relaxation.   ? ? ?Long-term goal:   ?Reduce overall level, frequency, and intensity of the feelings of emotional distress that inadvertently affects patient's mood, functioning and relationships for at least 3 consecutive months oer patient report.  ?  ?Short-term goal:  ?Decrease anxiety producing self talk such as thinking of the worse possible life outcome ?Increase assertiveness with his communication to family members to elicit more help with day-to-day chores and tasks as needed ?  ?Assessment of progress:  progressing ? ? ?This record has been created using [employer].  Chart creation errors have been sought, but may not always have been located and corrected. Such creation errors do not reflect on the standard of medical care.  ? ? ?Waldron Session, [facility]  ? ? ? ?

## 2022-01-09 ENCOUNTER — Other Ambulatory Visit (HOSPITAL_COMMUNITY): Payer: Self-pay

## 2022-01-09 ENCOUNTER — Encounter: Payer: Self-pay | Admitting: Nurse Practitioner

## 2022-01-09 ENCOUNTER — Other Ambulatory Visit: Payer: Self-pay

## 2022-01-09 ENCOUNTER — Ambulatory Visit: Payer: 59 | Admitting: Nurse Practitioner

## 2022-01-09 VITALS — BP 112/67 | HR 77 | Temp 98.0°F | Ht 70.08 in | Wt 236.8 lb

## 2022-01-09 DIAGNOSIS — Z7689 Persons encountering health services in other specified circumstances: Secondary | ICD-10-CM

## 2022-01-09 DIAGNOSIS — Z6833 Body mass index (BMI) 33.0-33.9, adult: Secondary | ICD-10-CM

## 2022-01-09 DIAGNOSIS — G43909 Migraine, unspecified, not intractable, without status migrainosus: Secondary | ICD-10-CM | POA: Diagnosis not present

## 2022-01-09 MED ORDER — RIZATRIPTAN BENZOATE 10 MG PO TABS
10.0000 mg | ORAL_TABLET | ORAL | 1 refills | Status: AC | PRN
  Filled 2022-01-09: qty 10, 30d supply, fill #0

## 2022-01-09 NOTE — Progress Notes (Signed)
? ?New Patient Office Visit ? ?Subjective:  ?Patient ID: David Sweeney, male    DOB: 16-Nov-2000  Age: 21 y.o. MRN: [id number] ? ?CC:  ?Chief Complaint  ?Patient presents with  ? New Patient (Initial Visit)  ? ? ?HPI ?David Sweeney presents to establish new primary care provider.  ?-frequent headaches. He states that headaches are sharp and stabbing in nature. They generally effect one area of the head. They last for a few hours before they resolve. He had been able to take excedrin for these headaches in the past, but this is not working so well any longer.  ?-history of asthma  ?-due to have routine exam and baseline fasting labs  ? ?Past Medical History:  ?Diagnosis Date  ? Asthma   ? ? ?Past Surgical History:  ?Procedure Laterality Date  ? TESTICLE TORSION REDUCTION    ? ? ?Family History  ?Problem Relation Age of Onset  ? ADD / ADHD Father   ? Hypertension Father   ? ADD / ADHD Sister   ? Depression Sister   ? ADD / ADHD Brother   ? ADD / ADHD Maternal Aunt   ? Depression Maternal Aunt   ? Alcohol abuse Paternal Aunt   ? Depression Paternal Aunt   ? ADD / ADHD Maternal Grandmother   ? Cancer Maternal Grandmother   ?     colon, appendix  ? Depression Maternal Grandmother   ? Hypertension Maternal Grandmother   ? Diabetes Paternal Grandfather   ? ? ?Social History  ? ?Socioeconomic History  ? Marital status: Single  ?  Spouse name: Not on file  ? Number of children: Not on file  ? Years of education: Not on file  ? Highest education level: Not on file  ?Occupational History  ? Not on file  ?Tobacco Use  ? Smoking status: Never  ? Smokeless tobacco: Never  ?Vaping Use  ? Vaping Use: Never used  ?Substance and Sexual Activity  ? Alcohol use: Yes  ?  Alcohol/week: 2.0 standard drinks  ?  Types: 2 Cans of beer per week  ? Drug use: Not Currently  ?  Types: Marijuana  ? Sexual activity: Not Currently  ?Other Topics Concern  ? Not on file  ?Social History Narrative  ? Alois is in the 12th grade at [employer];  he does well in school. He lives with parents and siblings. He enjoys video games, listening to music, and hanging out with friends.   ? ?Social Determinants of Health  ? ?Financial Resource Strain: Not on file  ?Food Insecurity: Not on file  ?Transportation Needs: Not on file  ?Physical Activity: Not on file  ?Stress: Not on file  ?Social Connections: Not on file  ?Intimate Partner Violence: Not on file  ? ? ?ROS ?Review of Systems  ?Constitutional:  Positive for fatigue. Negative for activity change, chills and fever.  ?HENT:  Negative for congestion, postnasal drip, rhinorrhea, sinus pressure, sinus pain, sneezing and sore throat.   ?Eyes: Negative.   ?Respiratory:  Negative for cough, shortness of breath and wheezing.   ?     Mild, intermittent asthma. Well managed with Qvar and albuterol inhaler as needed   ?Cardiovascular:  Negative for chest pain and palpitations.  ?Gastrointestinal:  Negative for constipation, diarrhea, nausea and vomiting.  ?Endocrine: Negative for cold intolerance, heat intolerance, polydipsia and polyuria.  ?Genitourinary:  Negative for dysuria, frequency and urgency.  ?Musculoskeletal:  Negative for back pain and myalgias.  ?  Skin:  Negative for rash.  ?Allergic/Immunologic: Positive for environmental allergies.  ?Neurological:  Positive for headaches. Negative for dizziness and weakness.  ?Psychiatric/Behavioral:  The patient is not nervous/anxious.   ? ?Objective:  ? ?Today's Vitals  ? 01/09/22 1437  ?BP: 112/67  ?Pulse: 77  ?Temp: 98 ?F (36.7 ?C)  ?SpO2: 96%  ?Weight: 236 lb 12.8 oz (107.4 kg)  ?Height: 5' 10.08" (1.78 m)  ? ?Body mass index is 33.9 kg/m?.  ? ?Physical Exam ?Vitals and nursing note reviewed.  ?Constitutional:   ?   Appearance: Normal appearance. He is well-developed.  ?HENT:  ?   Head: Normocephalic and atraumatic.  ?Eyes:  ?   Pupils: Pupils are equal, round, and reactive to light.  ?Cardiovascular:  ?   Rate and Rhythm: Normal rate and regular rhythm.  ?   Pulses:  Normal pulses.  ?   Heart sounds: Normal heart sounds.  ?Pulmonary:  ?   Effort: Pulmonary effort is normal.  ?   Breath sounds: Normal breath sounds.  ?Abdominal:  ?   Palpations: Abdomen is soft.  ?Musculoskeletal:     ?   General: Normal range of motion.  ?   Cervical back: Normal range of motion and neck supple.  ?Lymphadenopathy:  ?   Cervical: No cervical adenopathy.  ?Skin: ?   General: Skin is warm and dry.  ?   Capillary Refill: Capillary refill takes less than 2 seconds.  ?Neurological:  ?   General: No focal deficit present.  ?   Mental Status: He is alert and oriented to person, place, and time.  ?Psychiatric:     ?   Mood and Affect: Mood normal.     ?   Behavior: Behavior normal.     ?   Thought Content: Thought content normal.     ?   Judgment: Judgment normal.  ? ? ?Assessment & Plan:  ?1. Acute migraine ?Trial maxalt. Take one tablet at onset of headache. May repeat dose in 2 hours if needed. Advised him not to exceed two doses in 24 hours. May continue to take OTC medications as needed for acute headache  ?- rizatriptan (MAXALT) 10 MG tablet; Take 1 tablet (10 mg total) by mouth as needed for migraine. May repeat in 2 hours if needed  Dispense: 10 tablet; Refill: 1 ? ?2. Body mass index (BMI) of 33.0-33.9 in adult ?Encourage patient to limit calorie intake to 2000 cal/day or less.  He should consume a low cholesterol, low-fat diet.  Encouraged him to gradually incorporate exercise into his daily routine.  ? ?3. Encounter to establish care ?Appointment today to establish new primary care provider   ?  ?Problem List Items Addressed This Visit   ? ?  ? Cardiovascular and Mediastinum  ? Acute migraine - Primary  ? Relevant Medications  ? rizatriptan (MAXALT) 10 MG tablet  ?  ? Other  ? Body mass index (BMI) of 33.0-33.9 in adult  ? ?Other Visit Diagnoses   ? ? Encounter to establish care      ? ?  ? ? ?Outpatient Encounter Medications as of 01/09/2022  ?Medication Sig  ? albuterol (VENTOLIN HFA) 108 (90  Base) MCG/ACT inhaler Inhale 2 puffs into the lungs every 4-6 hours as needed for cough/whezze.  ? cetirizine (ZYRTEC) 10 MG tablet Take 10 mg by mouth daily.  ? fluticasone (FLONASE) 50 MCG/ACT nasal spray Place 1 spray into both nostrils daily.  ? fluticasone (FLONASE) 50 MCG/ACT nasal spray  Place 1 spray into both nostrils daily.  ? fluticasone (FLOVENT HFA) 110 MCG/ACT inhaler Inhale 2 puffs into the lungs 2 (two) times daily.  ? rizatriptan (MAXALT) 10 MG tablet Take 1 tablet (10 mg total) by mouth as needed for migraine. May repeat in 2 hours if needed  ? [DISCONTINUED] sertraline (ZOLOFT) 100 MG tablet Take 1 tablet (100 mg total) by mouth daily.  ? albuterol (VENTOLIN HFA) 108 (90 Base) MCG/ACT inhaler INHALE 2 PUFFS BY MOUTH EVERY 4 TO 6 HOURS AS NEEDED FOR COUGH AND WHEEZE  ? beclomethasone (QVAR REDIHALER) 80 MCG/ACT inhaler Inhale 2 puffs into the lungs 2 (two) times daily. (Patient not taking: Reported on 09/05/2021)  ? fluticasone (FLONASE) 50 MCG/ACT nasal spray PLACE 1 SPRAY INTO EACH NOSTRIL ONCE DAILY.  ? fluticasone (FLOVENT HFA) 110 MCG/ACT inhaler INHALE 2 PUFFS BY MOUTH INTO THE LUNGS TWICE A DAY  ? ?No facility-administered encounter medications on file as of 01/09/2022.  ? ? ?Follow-up: Return in about 4 weeks (around 02/06/2022) for health maintenance exam, FBW at time of visit with free t4 .  ? ?Carlean JewsHeather E Zair Borawski, NP ? ?

## 2022-01-17 ENCOUNTER — Ambulatory Visit (INDEPENDENT_AMBULATORY_CARE_PROVIDER_SITE_OTHER): Payer: 59 | Admitting: Psychiatry

## 2022-01-17 ENCOUNTER — Encounter: Payer: Self-pay | Admitting: Psychiatry

## 2022-01-17 ENCOUNTER — Other Ambulatory Visit (HOSPITAL_COMMUNITY): Payer: Self-pay

## 2022-01-17 DIAGNOSIS — F411 Generalized anxiety disorder: Secondary | ICD-10-CM

## 2022-01-17 DIAGNOSIS — F422 Mixed obsessional thoughts and acts: Secondary | ICD-10-CM | POA: Diagnosis not present

## 2022-01-17 DIAGNOSIS — Z6833 Body mass index (BMI) 33.0-33.9, adult: Secondary | ICD-10-CM | POA: Insufficient documentation

## 2022-01-17 DIAGNOSIS — G43909 Migraine, unspecified, not intractable, without status migrainosus: Secondary | ICD-10-CM | POA: Insufficient documentation

## 2022-01-17 DIAGNOSIS — Z6839 Body mass index (BMI) 39.0-39.9, adult: Secondary | ICD-10-CM | POA: Insufficient documentation

## 2022-01-17 DIAGNOSIS — F22 Delusional disorders: Secondary | ICD-10-CM

## 2022-01-17 DIAGNOSIS — F331 Major depressive disorder, recurrent, moderate: Secondary | ICD-10-CM

## 2022-01-17 MED ORDER — SERTRALINE HCL 100 MG PO TABS
100.0000 mg | ORAL_TABLET | Freq: Every day | ORAL | 0 refills | Status: DC
  Filled 2022-01-17 – 2022-04-09 (×2): qty 90, 90d supply, fill #0

## 2022-01-17 MED ORDER — BREXPIPRAZOLE 2 MG PO TABS
2.0000 mg | ORAL_TABLET | ORAL | 2 refills | Status: DC
  Filled 2022-01-17: qty 30, 30d supply, fill #0
  Filled 2022-03-03: qty 30, 30d supply, fill #1
  Filled 2022-04-09: qty 30, 30d supply, fill #2

## 2022-01-17 NOTE — Progress Notes (Signed)
RANVIR RENOVATO ?[id number] ?[birth year] ?21 y.o. ? ?Virtual Visit via Telephone Note ? ?I connected with pt on 01/17/22 at 11:30 AM EDT by telephone and verified that I am speaking with the correct person using two identifiers. ?  ?I discussed the limitations, risks, security and privacy concerns of performing an evaluation and management service by telephone and the availability of in person appointments. I also discussed with the patient that there may be a patient responsible charge related to this service. The patient expressed understanding and agreed to proceed. ?  ?I discussed the assessment and treatment plan with the patient. The patient was provided an opportunity to ask questions and all were answered. The patient agreed with the plan and demonstrated an understanding of the instructions. ?  ?The patient was advised to call back or seek an in-person evaluation if the symptoms worsen or if the condition fails to improve as anticipated. ? ?I provided 30 minutes of non-face-to-face time during this encounter.  The patient was located at home.  The provider was located at [facility]. ? ? ?Corie Chiquito, PMHNP ? ? ?Subjective:  ? ?Patient ID:  David Sweeney is a 21 y.o. (DOB Aug 04, 2001) male. ? ?Chief Complaint:  ?Chief Complaint  ?Patient presents with  ? Depression  ? Anxiety  ? ? ?Depression ?       Past medical history includes anxiety.   ?Anxiety ? ? ? ?David Sweeney presents for follow-up of depression and anxiety. "At the moment it is getting better." He reports that since his last visit he had major depressive episode with suicidal ideation after stopping Rexulti. He reports that he tends to have depressive episodes certain times of the year and often has more depressive episodes this time of year. He reports that most recent depressive episode was more severe compared to the past. He reports that depressive s/s have improved since then. He reports in the past he questioned if Rexulti was  causing agitation and "now I think it made me less easily agitated. He reports occasional panic attacks about once a month. He reports that worry has "been alright." He reports some checking behaviors and reports that this is due to family leaving some things undone. Sleep has improved recently. He reports that his sleep schedule has improved and is now getting up earlier again. He reports that his appetite has been ok. He reports that he has been intentionally trying to lose weight. Energy has been good. He reports that he has been trying to "improve myself over the last month... exercising more, taking care of my skin, hair" and saw PCP. Motivation has been better. He reports "self-doubt" surrounding some long-term goals. He reports difficulty with concentration and focus. He reports that he has difficulty sustaining focus for the past several years. He reports that he missed last apt and lost track of time.  ? ?He reports that passive death wishes have been "less frequent."  ? ?He reports that he has been having some transportation issues. He reports that he ended relationship and this has reduced stress.  ? ?Past Psychiatric Medication Trials: ?Clomipramine- helpful for OCD. Caused worsening depression. ?Lexapro ?Luvox ?Zoloft ?Effexor XR ?Rexulti ?Saphris ?Klonopin ? ?Review of Systems:  ?Review of Systems  ?Musculoskeletal:  Negative for gait problem.  ?     Occ teeth grinding when bored  ?Neurological:  Negative for tremors.  ?     Occ migraines  ?Psychiatric/Behavioral:  Positive for depression.   ?  Please refer to HPI  ? ?Denies involuntary movements.  ? ?Medications: I have reviewed the patient's current medications. ? ?Current Outpatient Medications  ?Medication Sig Dispense Refill  ? albuterol (VENTOLIN HFA) 108 (90 Base) MCG/ACT inhaler Inhale 2 puffs into the lungs every 4-6 hours as needed for cough/whezze. 18 g 0  ? cetirizine (ZYRTEC) 10 MG tablet Take 10 mg by mouth daily.    ? fluticasone  (FLONASE) 50 MCG/ACT nasal spray Place 1 spray into both nostrils daily. 16 g 5  ? fluticasone (FLOVENT HFA) 110 MCG/ACT inhaler Inhale 2 puffs into the lungs 2 (two) times daily. 12 g 5  ? rizatriptan (MAXALT) 10 MG tablet Take 1 tablet (10 mg total) by mouth as needed for migraine. May repeat in 2 hours if needed 10 tablet 1  ? albuterol (VENTOLIN HFA) 108 (90 Base) MCG/ACT inhaler INHALE 2 PUFFS BY MOUTH EVERY 4 TO 6 HOURS AS NEEDED FOR COUGH AND WHEEZE 18 g 0  ? beclomethasone (QVAR REDIHALER) 80 MCG/ACT inhaler Inhale 2 puffs into the lungs 2 (two) times daily. (Patient not taking: Reported on 09/05/2021) 10.6 g 5  ? brexpiprazole (REXULTI) 2 MG TABS tablet Take 1 tablet (2 mg total) by mouth daily after breakfast. 90 tablet 0  ? brexpiprazole (REXULTI) 2 MG TABS tablet Take every other day for 2 weeks, then increase to daily 30 tablet 2  ? fluticasone (FLONASE) 50 MCG/ACT nasal spray Place 1 spray into both nostrils daily.    ? fluticasone (FLONASE) 50 MCG/ACT nasal spray PLACE 1 SPRAY INTO EACH NOSTRIL ONCE DAILY. 16 g 5  ? fluticasone (FLOVENT HFA) 110 MCG/ACT inhaler INHALE 2 PUFFS BY MOUTH INTO THE LUNGS TWICE A DAY 12 g 5  ? sertraline (ZOLOFT) 100 MG tablet Take 1 tablet (100 mg total) by mouth daily. 90 tablet 0  ? ?No current facility-administered medications for this visit.  ? ? ?Medication Side Effects: None ? ?Allergies: No Known Allergies ? ?Past Medical History:  ?Diagnosis Date  ? Asthma   ? ? ?Family History  ?Problem Relation Age of Onset  ? ADD / ADHD Father   ? Hypertension Father   ? ADD / ADHD Sister   ? Depression Sister   ? ADD / ADHD Brother   ? ADD / ADHD Maternal Aunt   ? Depression Maternal Aunt   ? Alcohol abuse Paternal Aunt   ? Depression Paternal Aunt   ? ADD / ADHD Maternal Grandmother   ? Cancer Maternal Grandmother   ?     colon, appendix  ? Depression Maternal Grandmother   ? Hypertension Maternal Grandmother   ? Diabetes Paternal Grandfather   ? ? ?Social History   ? ?Socioeconomic History  ? Marital status: Single  ?  Spouse name: Not on file  ? Number of children: Not on file  ? Years of education: Not on file  ? Highest education level: Not on file  ?Occupational History  ? Not on file  ?Tobacco Use  ? Smoking status: Never  ? Smokeless tobacco: Never  ?Vaping Use  ? Vaping Use: Never used  ?Substance and Sexual Activity  ? Alcohol use: Yes  ?  Alcohol/week: 2.0 standard drinks  ?  Types: 2 Cans of beer per week  ? Drug use: Not Currently  ?  Types: Marijuana  ? Sexual activity: Not Currently  ?Other Topics Concern  ? Not on file  ?Social History Narrative  ? David Sweeney is in the 12th grade at [employer]; he does well in school. He lives with parents and siblings. He enjoys video games, listening to music, and hanging out with friends.   ? ?Social Determinants of Health  ? ?Financial Resource Strain: Not on file  ?Food Insecurity: Not on file  ?Transportation Needs: Not on file  ?Physical Activity: Not on file  ?Stress: Not on file  ?Social Connections: Not on file  ?Intimate Partner Violence: Not on file  ? ? ?Past Medical History, Surgical history, Social history, and Family history were reviewed and updated as appropriate.  ? ?Please see review of systems for further details on the patient's review from today.  ? ?Objective:  ? ?Physical Exam:  ?Wt 236 lb (107 kg)   BMI 33.79 kg/m?  ? ?Physical Exam ?Neurological:  ?   Mental Status: He is alert and oriented to person, place, and time.  ?   Cranial Nerves: No dysarthria.  ?Psychiatric:     ?   Attention and Perception: Attention and perception normal.     ?   Mood and Affect: Mood is anxious.     ?   Speech: Speech normal.     ?   Behavior: Behavior is cooperative.     ?   Thought Content: Thought content normal. Thought content is not paranoid or delusional. Thought content does not include homicidal or suicidal ideation. Thought content does not include homicidal or suicidal plan.     ?   Cognition and Memory:  Cognition and memory normal.     ?   Judgment: Judgment normal.  ?   Comments: Insight intact ?Mood is mildly depressed  ? ? ?Lab Review:  ?No results found for: NA, K, CL, CO2, GLUCOSE, BUN, CREATININE, CALCIUM, PROT, ALB

## 2022-01-24 ENCOUNTER — Ambulatory Visit: Payer: 59 | Admitting: Mental Health

## 2022-01-24 DIAGNOSIS — F422 Mixed obsessional thoughts and acts: Secondary | ICD-10-CM

## 2022-01-24 NOTE — Progress Notes (Signed)
[facility] psychotherapy Note ? ?Name: David Sweeney ?Date: 01/24/22 ?MRN: [id number] ?DOB: [birth year] ?PCP: Carlean Jews, NP ? ?Time spent: 51 minutes ? ?Treatment:  Individual therapy ? ?Virtual Visit via Telehealth Note ?Connected with patient by a telemedicine/telehealth application, with their informed consent, and verified patient privacy and that I am speaking with the correct person using two identifiers. I discussed the limitations, risks, security and privacy concerns of performing psychotherapy and the availability of in person appointments. I also discussed with the patient that there may be a patient responsible charge related to this service. The patient expressed understanding and agreed to proceed. ?I discussed the treatment planning with the patient. The patient was provided an opportunity to ask questions and all were answered. The patient agreed with the plan and demonstrated an understanding of the instructions. The patient was advised to call  our office if  symptoms worsen or feel they are in a crisis state and need immediate contact. ?  ?Therapist Location: office ?Patient Location: home ?  ? ?Mental Status Exam: ?   ?Appearance:    Casual     ?Behavior:   Appropriate  ?Motor:   WNL  ?Speech/Language:    Clear and Coherent  ?Affect:   Full range   ?Mood:   Euthymic  ?Thought process:   normal  ?Thought content:     WNL  ?Sensory/Perceptual disturbances:     none  ?Orientation:   x4  ?Attention:   Good  ?Concentration:   Good  ?Memory:   Intact  ?Fund of knowledge:    Consistent with age and development  ?Insight:     Good  ?Judgment:    Good  ?Impulse Control:   Good  ?  ? ?Reported Symptoms:  anxiety, neg self talk, stress-eating, "overly self conscious". ? ?Risk Assessment: ?Danger to Self:  No ?Self-injurious Behavior: No ?Danger to Others: No ?Duty to Warn:no ?Physical Aggression / Violence:No  ?Access to Firearms a concern: No  ?Gang Involvement:No  ?Patient / guardian was  educated about steps to take if suicide or homicide risk level increases between visits: yes ?While future psychiatric events cannot be accurately predicted, the patient does not currently require acute inpatient psychiatric care and does not currently meet [facility] involuntary commitment criteria. ? ? ? ? ?Medications: ?Current Outpatient Medications  ?Medication Sig Dispense Refill  ? albuterol (VENTOLIN HFA) 108 (90 Base) MCG/ACT inhaler INHALE 2 PUFFS BY MOUTH EVERY 4 TO 6 HOURS AS NEEDED FOR COUGH AND WHEEZE 18 g 0  ? albuterol (VENTOLIN HFA) 108 (90 Base) MCG/ACT inhaler Inhale 2 puffs into the lungs every 4-6 hours as needed for cough/whezze. 18 g 0  ? beclomethasone (QVAR REDIHALER) 80 MCG/ACT inhaler Inhale 2 puffs into the lungs 2 (two) times daily. (Patient not taking: Reported on 09/05/2021) 10.6 g 5  ? brexpiprazole (REXULTI) 2 MG TABS tablet Take 1 tablet (2 mg total) by mouth daily after breakfast. 90 tablet 0  ? brexpiprazole (REXULTI) 2 MG TABS tablet Take 1 tablet (2 mg total) by mouth every other day for 14 days, then take 1 tablet (2mg ) by mouth daily. 30 tablet 2  ? cetirizine (ZYRTEC) 10 MG tablet Take 10 mg by mouth daily.    ? fluticasone (FLONASE) 50 MCG/ACT nasal spray Place 1 spray into both nostrils daily.    ? fluticasone (FLONASE) 50 MCG/ACT nasal spray PLACE 1 SPRAY INTO EACH NOSTRIL ONCE DAILY. 16 g 5  ? fluticasone (FLONASE) 50 MCG/ACT nasal spray  Place 1 spray into both nostrils daily. 16 g 5  ? fluticasone (FLOVENT HFA) 110 MCG/ACT inhaler INHALE 2 PUFFS BY MOUTH INTO THE LUNGS TWICE A DAY 12 g 5  ? fluticasone (FLOVENT HFA) 110 MCG/ACT inhaler Inhale 2 puffs into the lungs 2 (two) times daily. 12 g 5  ? rizatriptan (MAXALT) 10 MG tablet Take 1 tablet (10 mg total) by mouth as needed for migraine. May repeat in 2 hours if needed 10 tablet 1  ? sertraline (ZOLOFT) 100 MG tablet Take 1 tablet (100 mg total) by mouth daily. 90 tablet 0  ? ?No current facility-administered  medications for this visit.  ? ?Subjective:  ?Patient presents for session on time.  He stated that he is less depressed since last session.  He went on to share how he has made more efforts around the house to be helpful toward his parents.  He stated they both work full-time, currently his mother working 2 jobs.  He stated that he makes a considerable effort to keep the house clean and prepare meals for them daily.  His mother requires a clean house to keep her anxiety and stress low, patient going on to share how he considers her assessment of the house being clean as excessive but that he is used to it and tries to accommodate her by cleaning thoroughly.  He stated he has not made a lot of effort toward looking for employment recently, focusing more on the day-to-day management of the house as described.  He continues to express needing to find a job to not only earn income but also to further fulfill purpose in his life however, admits the challenge of knowing what job might be in terms of a potential career is the challenge.  Provide him a resource for further identifying potential career path where he plans to investigate between sessions. ? ? ? ? ?Intervention: CBT, supportive therapy, motivational interviewing ? ? ?Diagnoses:  ?  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  ? ? ? ? ? ? ?Plan: Patient is to use CBT, mindfulness and coping skills to help manage stress,, and identify outlets for stress management, relaxation.   ? ? ?Long-term goal:   ?Reduce overall level, frequency, and intensity of the feelings of emotional distress that inadvertently affects patient's mood, functioning and relationships for at least 3 consecutive months oer patient report.  ?  ?Short-term goal:  ?Decrease anxiety producing self talk such as thinking of the worse possible life outcome ?Increase assertiveness with his communication to family members to elicit more help with day-to-day chores and tasks as  needed ?  ?Assessment of progress:  progressing ? ? ?This record has been created using [employer].  Chart creation errors have been sought, but may not always have been located and corrected. Such creation errors do not reflect on the standard of medical care.  ? ? ?Waldron Session, [facility]  ? ? ? ?

## 2022-01-31 ENCOUNTER — Other Ambulatory Visit: Payer: Self-pay

## 2022-01-31 DIAGNOSIS — R5383 Other fatigue: Secondary | ICD-10-CM

## 2022-01-31 DIAGNOSIS — Z0000 Encounter for general adult medical examination without abnormal findings: Secondary | ICD-10-CM

## 2022-01-31 DIAGNOSIS — Z130 Encounter for screening for diseases of the blood and blood-forming organs and certain disorders involving the immune mechanism: Secondary | ICD-10-CM

## 2022-02-08 ENCOUNTER — Other Ambulatory Visit: Payer: 59

## 2022-02-08 DIAGNOSIS — R5383 Other fatigue: Secondary | ICD-10-CM

## 2022-02-08 DIAGNOSIS — Z0000 Encounter for general adult medical examination without abnormal findings: Secondary | ICD-10-CM

## 2022-02-08 DIAGNOSIS — Z13228 Encounter for screening for other metabolic disorders: Secondary | ICD-10-CM | POA: Diagnosis not present

## 2022-02-08 DIAGNOSIS — Z130 Encounter for screening for diseases of the blood and blood-forming organs and certain disorders involving the immune mechanism: Secondary | ICD-10-CM

## 2022-02-08 DIAGNOSIS — Z1321 Encounter for screening for nutritional disorder: Secondary | ICD-10-CM | POA: Diagnosis not present

## 2022-02-08 DIAGNOSIS — Z1329 Encounter for screening for other suspected endocrine disorder: Secondary | ICD-10-CM | POA: Diagnosis not present

## 2022-02-09 ENCOUNTER — Other Ambulatory Visit: Payer: 59

## 2022-02-09 LAB — CBC WITH DIFFERENTIAL/PLATELET
Basophils Absolute: 0 10*3/uL (ref 0.0–0.2)
Basos: 1 %
EOS (ABSOLUTE): 0.1 10*3/uL (ref 0.0–0.4)
Eos: 2 %
Hematocrit: 49.4 % (ref 37.5–51.0)
Hemoglobin: 16.9 g/dL (ref 13.0–17.7)
Immature Grans (Abs): 0 10*3/uL (ref 0.0–0.1)
Immature Granulocytes: 0 %
Lymphocytes Absolute: 2 10*3/uL (ref 0.7–3.1)
Lymphs: 34 %
MCH: 29.8 pg (ref 26.6–33.0)
MCHC: 34.2 g/dL (ref 31.5–35.7)
MCV: 87 fL (ref 79–97)
Monocytes Absolute: 0.4 10*3/uL (ref 0.1–0.9)
Monocytes: 6 %
Neutrophils Absolute: 3.5 10*3/uL (ref 1.4–7.0)
Neutrophils: 57 %
Platelets: 188 10*3/uL (ref 150–450)
RBC: 5.67 x10E6/uL (ref 4.14–5.80)
RDW: 12.8 % (ref 11.6–15.4)
WBC: 6 10*3/uL (ref 3.4–10.8)

## 2022-02-09 LAB — LIPID PANEL
Chol/HDL Ratio: 4.7 ratio (ref 0.0–5.0)
Cholesterol, Total: 187 mg/dL (ref 100–199)
HDL: 40 mg/dL (ref >39)
LDL Chol Calc (NIH): 117 mg/dL — ABNORMAL HIGH (ref 0–99)
Triglycerides: 168 mg/dL — ABNORMAL HIGH (ref 0–149)
VLDL Cholesterol Cal: 30 mg/dL (ref 5–40)

## 2022-02-09 LAB — COMPREHENSIVE METABOLIC PANEL
ALT: 24 IU/L (ref 0–44)
AST: 16 IU/L (ref 0–40)
Albumin/Globulin Ratio: 1.5 (ref 1.2–2.2)
Albumin: 4.4 g/dL (ref 4.1–5.2)
Alkaline Phosphatase: 69 IU/L (ref 44–121)
BUN/Creatinine Ratio: 9 (ref 9–20)
BUN: 9 mg/dL (ref 6–20)
Bilirubin Total: 0.5 mg/dL (ref 0.0–1.2)
CO2: 24 mmol/L (ref 20–29)
Calcium: 9.6 mg/dL (ref 8.7–10.2)
Chloride: 104 mmol/L (ref 96–106)
Creatinine, Ser: 0.97 mg/dL (ref 0.76–1.27)
Globulin, Total: 3 g/dL (ref 1.5–4.5)
Glucose: 97 mg/dL (ref 70–99)
Potassium: 4.5 mmol/L (ref 3.5–5.2)
Sodium: 142 mmol/L (ref 134–144)
Total Protein: 7.4 g/dL (ref 6.0–8.5)
eGFR: 113 mL/min/{1.73_m2} (ref >59)

## 2022-02-09 LAB — TSH: TSH: 1.51 u[IU]/mL (ref 0.450–4.500)

## 2022-02-09 LAB — HEMOGLOBIN A1C
Est. average glucose Bld gHb Est-mCnc: 108 mg/dL
Hgb A1c MFr Bld: 5.4 % (ref 4.8–5.6)

## 2022-02-09 LAB — T4, FREE: Free T4: 1.03 ng/dL (ref 0.82–1.77)

## 2022-02-11 NOTE — Progress Notes (Signed)
Mild elevation LDL and triglycerides. Discuss with patient at visit 02/13/2022.

## 2022-02-13 ENCOUNTER — Encounter: Payer: Self-pay | Admitting: Nurse Practitioner

## 2022-02-13 ENCOUNTER — Ambulatory Visit (INDEPENDENT_AMBULATORY_CARE_PROVIDER_SITE_OTHER): Payer: 59 | Admitting: Mental Health

## 2022-02-13 ENCOUNTER — Ambulatory Visit (INDEPENDENT_AMBULATORY_CARE_PROVIDER_SITE_OTHER): Payer: 59 | Admitting: Nurse Practitioner

## 2022-02-13 ENCOUNTER — Other Ambulatory Visit (HOSPITAL_COMMUNITY): Payer: Self-pay

## 2022-02-13 ENCOUNTER — Other Ambulatory Visit: Payer: Self-pay

## 2022-02-13 VITALS — BP 106/67 | HR 71 | Temp 98.2°F | Ht 70.08 in | Wt 234.2 lb

## 2022-02-13 DIAGNOSIS — Z23 Encounter for immunization: Secondary | ICD-10-CM | POA: Diagnosis not present

## 2022-02-13 DIAGNOSIS — F331 Major depressive disorder, recurrent, moderate: Secondary | ICD-10-CM

## 2022-02-13 DIAGNOSIS — E782 Mixed hyperlipidemia: Secondary | ICD-10-CM | POA: Diagnosis not present

## 2022-02-13 DIAGNOSIS — F422 Mixed obsessional thoughts and acts: Secondary | ICD-10-CM

## 2022-02-13 DIAGNOSIS — Z0001 Encounter for general adult medical examination with abnormal findings: Secondary | ICD-10-CM

## 2022-02-13 DIAGNOSIS — Z6833 Body mass index (BMI) 33.0-33.9, adult: Secondary | ICD-10-CM | POA: Diagnosis not present

## 2022-02-13 DIAGNOSIS — G43909 Migraine, unspecified, not intractable, without status migrainosus: Secondary | ICD-10-CM

## 2022-02-13 MED ORDER — FLUTICASONE PROPIONATE HFA 110 MCG/ACT IN AERO
2.0000 | INHALATION_SPRAY | Freq: Two times a day (BID) | RESPIRATORY_TRACT | 5 refills | Status: DC
  Filled 2022-02-13: qty 12, 30d supply, fill #0
  Filled 2022-08-01: qty 12, 30d supply, fill #1

## 2022-02-13 NOTE — Progress Notes (Signed)
[facility] psychotherapy Note ? ?Name: David Sweeney ?Date: 02/13/22 ?MRN: [id number] ?DOB: 11-28-2000 ?PCP: Carlean Jews, NP ? ?Time spent: 55 minutes ? ?Treatment:  Individual therapy ? ? ?Mental Status Exam: ?   ?Appearance:    Casual     ?Behavior:   Appropriate  ?Motor:   WNL  ?Speech/Language:    Clear and Coherent  ?Affect:   Full range   ?Mood:   Euthymic  ?Thought process:   normal  ?Thought content:     WNL  ?Sensory/Perceptual disturbances:     none  ?Orientation:   x4  ?Attention:   Good  ?Concentration:   Good  ?Memory:   Intact  ?Fund of knowledge:    Consistent with age and development  ?Insight:     Good  ?Judgment:    Good  ?Impulse Control:   Good  ?  ? ?Reported Symptoms:  anxiety, neg self talk, stress-eating, "overly self conscious". ? ?Risk Assessment: ?Danger to Self:  No ?Self-injurious Behavior: No ?Danger to Others: No ?Duty to Warn:no ?Physical Aggression / Violence:No  ?Access to Firearms a concern: No  ?Gang Involvement:No  ?Patient / guardian was educated about steps to take if suicide or homicide risk level increases between visits: yes ?While future psychiatric events cannot be accurately predicted, the patient does not currently require acute inpatient psychiatric care and does not currently meet [facility] involuntary commitment criteria. ? ? ? ? ?Medications: ?Current Outpatient Medications  ?Medication Sig Dispense Refill  ? albuterol (VENTOLIN HFA) 108 (90 Base) MCG/ACT inhaler INHALE 2 PUFFS BY MOUTH EVERY 4 TO 6 HOURS AS NEEDED FOR COUGH AND WHEEZE 18 g 0  ? brexpiprazole (REXULTI) 2 MG TABS tablet Take 1 tablet (2 mg total) by mouth every other day for 14 days, then take 1 tablet (2mg ) by mouth daily. 30 tablet 2  ? cetirizine (ZYRTEC) 10 MG tablet Take 10 mg by mouth daily.    ? fluticasone (FLONASE) 50 MCG/ACT nasal spray Place 1 spray into both nostrils daily. 16 g 5  ? fluticasone (FLOVENT HFA) 110 MCG/ACT inhaler INHALE 2 PUFFS BY MOUTH INTO THE LUNGS  TWICE A DAY 12 g 5  ? fluticasone (FLOVENT HFA) 110 MCG/ACT inhaler Inhale 2 puffs into the lungs 2 (two) times daily. 12 g 5  ? rizatriptan (MAXALT) 10 MG tablet Take 1 tablet (10 mg total) by mouth as needed for migraine. May repeat in 2 hours if needed 10 tablet 1  ? sertraline (ZOLOFT) 100 MG tablet Take 1 tablet (100 mg total) by mouth daily. 90 tablet 0  ? ?No current facility-administered medications for this visit.  ? ?Subjective:  ?Patient presents for session on time.  Patient shared how he wants to improve his communication and social settings.  Patient is primary social outlet is on communication with others and chat groups, online gaming etc.  Time was spent to further explore past experiences that have made it difficult for him to feel confident and assertive to take steps to communicate in social settings.  Patient stated that in middle to late childhood, he was made to feel shamed for the way he would try to communicate with others..  He stated that as a result, he found himself avoiding others often, filtering what he would say as to not say "the wrong thing".  Assisted him in identifying ways he can take steps to be more social and to immerse himself situations where he would have opportunities to communicate more openly  with others.  He stated that one of his online games that he has started to play would be a good opportunity.  He stated that he tends to question himself about if he is saying the right thing or at times gets anxious to the point where he may say something that is not helpful in the game.  Assisted him in some self critical talk identified in today's session.  Gave him a homework assignment to identify 2 personal strengths daily between sessions. ? ? ? ? ?Intervention: CBT, supportive therapy, motivational interviewing ? ? ?Diagnoses:  ?  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  ? ? ? ? ? ? ? ?Plan: Patient is to use CBT, mindfulness and coping skills to  help manage stress,, and identify outlets for stress management, relaxation.   ? ? ?Long-term goal:   ?Reduce overall level, frequency, and intensity of the feelings of emotional distress that inadvertently affects patient's mood, functioning and relationships for at least 3 consecutive months oer patient report.  ?  ?Short-term goal:  ?Decrease anxiety producing self talk such as thinking of the worse possible life outcome ?Increase assertiveness with his communication to family members to elicit more help with day-to-day chores and tasks as needed ?  ?Assessment of progress:  progressing ? ? ?This record has been created using [employer].  Chart creation errors have been sought, but may not always have been located and corrected. Such creation errors do not reflect on the standard of medical care.  ? ? ?Waldron Session, [facility]  ? ? ? ?

## 2022-02-13 NOTE — Progress Notes (Signed)
.Established patient visit ? ? ?Patient: David Sweeney   DOB: [birth year]   21 y.o. Male  MRN: [id number] ?Visit Date: 02/13/2022 ? ? ?Chief Complaint  ?Patient presents with  ? Annual Exam  ? ?Subjective  ?  ?HPI  ?The patient is here for annual wellness visit . ?-migraine headaches - has tried the previously prescribed Maxalt. He feels like this did help  ?-routine, fasting labs done prior to this visit. ?--mild elevation of LDL and triglycerides  ?-no new concerns or complaints ?-does see psychiatry and therapist for anxiety and depression.  ?Would like to get TDAP vaccine today ? ? ? ?Medications: ?Outpatient Medications Prior to Visit  ?Medication Sig  ? brexpiprazole (REXULTI) 2 MG TABS tablet Take 1 tablet (2 mg total) by mouth every other day for 14 days, then take 1 tablet (63m) by mouth daily.  ? cetirizine (ZYRTEC) 10 MG tablet Take 10 mg by mouth daily.  ? fluticasone (FLONASE) 50 MCG/ACT nasal spray Place 1 spray into both nostrils daily.  ? rizatriptan (MAXALT) 10 MG tablet Take 1 tablet (10 mg total) by mouth as needed for migraine. May repeat in 2 hours if needed  ? sertraline (ZOLOFT) 100 MG tablet Take 1 tablet (100 mg total) by mouth daily.  ? albuterol (VENTOLIN HFA) 108 (90 Base) MCG/ACT inhaler INHALE 2 PUFFS BY MOUTH EVERY 4 TO 6 HOURS AS NEEDED FOR COUGH AND WHEEZE  ? fluticasone (FLOVENT HFA) 110 MCG/ACT inhaler INHALE 2 PUFFS BY MOUTH INTO THE LUNGS TWICE A DAY  ? [DISCONTINUED] albuterol (VENTOLIN HFA) 108 (90 Base) MCG/ACT inhaler Inhale 2 puffs into the lungs every 4-6 hours as needed for cough/whezze.  ? [DISCONTINUED] beclomethasone (QVAR REDIHALER) 80 MCG/ACT inhaler Inhale 2 puffs into the lungs 2 (two) times daily. (Patient not taking: Reported on 09/05/2021)  ? [DISCONTINUED] brexpiprazole (REXULTI) 2 MG TABS tablet Take 1 tablet (2 mg total) by mouth daily after breakfast.  ? [DISCONTINUED] fluticasone (FLONASE) 50 MCG/ACT nasal spray Place 1 spray into both nostrils daily.  ?  [DISCONTINUED] fluticasone (FLONASE) 50 MCG/ACT nasal spray PLACE 1 SPRAY INTO EACH NOSTRIL ONCE DAILY.  ? [DISCONTINUED] fluticasone (FLOVENT HFA) 110 MCG/ACT inhaler Inhale 2 puffs into the lungs 2 (two) times daily.  ? ?No facility-administered medications prior to visit.  ? ? ?Review of Systems  ?Constitutional:  Negative for activity change, chills, fatigue and fever.  ?HENT:  Negative for congestion, postnasal drip, rhinorrhea, sinus pressure, sinus pain, sneezing and sore throat.   ?Eyes: Negative.   ?Respiratory:  Negative for cough, shortness of breath and wheezing.   ?Cardiovascular:  Negative for chest pain and palpitations.  ?Gastrointestinal:  Negative for constipation, diarrhea, nausea and vomiting.  ?Endocrine: Negative for cold intolerance, heat intolerance, polydipsia and polyuria.  ?Genitourinary:  Negative for dysuria, frequency and urgency.  ?Musculoskeletal:  Negative for back pain and myalgias.  ?Skin:  Negative for rash.  ?Allergic/Immunologic: Negative for environmental allergies.  ?Neurological:  Negative for dizziness, weakness and headaches.  ?Psychiatric/Behavioral:  The patient is not nervous/anxious.   ? ?Last CBC ?Lab Results  ?Component Value Date  ? WBC 6.0 02/08/2022  ? HGB 16.9 02/08/2022  ? HCT 49.4 02/08/2022  ? MCV 87 02/08/2022  ? MCH 29.8 02/08/2022  ? RDW 12.8 02/08/2022  ? PLT 188 02/08/2022  ? ?Last metabolic panel ?Lab Results  ?Component Value Date  ? GLUCOSE 97 02/08/2022  ? NA 142 02/08/2022  ? K 4.5 02/08/2022  ? CL 104 02/08/2022  ?  CO2 24 02/08/2022  ? BUN 9 02/08/2022  ? CREATININE 0.97 02/08/2022  ? EGFR 113 02/08/2022  ? CALCIUM 9.6 02/08/2022  ? PROT 7.4 02/08/2022  ? ALBUMIN 4.4 02/08/2022  ? LABGLOB 3.0 02/08/2022  ? AGRATIO 1.5 02/08/2022  ? BILITOT 0.5 02/08/2022  ? ALKPHOS 69 02/08/2022  ? AST 16 02/08/2022  ? ALT 24 02/08/2022  ? ?Last lipids ?Lab Results  ?Component Value Date  ? CHOL 187 02/08/2022  ? HDL 40 02/08/2022  ? LDLCALC 117 (H) 02/08/2022  ?  TRIG 168 (H) 02/08/2022  ? CHOLHDL 4.7 02/08/2022  ? ?Last hemoglobin A1c ?Lab Results  ?Component Value Date  ? HGBA1C 5.4 02/08/2022  ? ?Last thyroid functions ?Lab Results  ?Component Value Date  ? TSH 1.510 02/08/2022  ? ?  ? ? Objective  ?  ? ?Today's Vitals  ? 02/13/22 1131  ?BP: 106/67  ?Pulse: 71  ?Temp: 98.2 ?F (36.8 ?C)  ?SpO2: 99%  ?Weight: 234 lb 3.2 oz (106.2 kg)  ?Height: 5' 10.08" (1.78 m)  ? ?Body mass index is 33.53 kg/m?.  ? ?BP Readings from Last 3 Encounters:  ?02/13/22 106/67  ?01/09/22 112/67  ?10/13/19 116/69  ?  ?Wt Readings from Last 3 Encounters:  ?02/13/22 234 lb 3.2 oz (106.2 kg)  ?01/09/22 236 lb 12.8 oz (107.4 kg)  ?10/13/19 212 lb 1.6 oz (96.2 kg) (95 %, Z= 1.69)*  ? ?* Growth percentiles are based on CDC (Boys, 2-20 Years) data.  ?  ?Physical Exam ?Vitals and nursing note reviewed.  ?Constitutional:   ?   Appearance: Normal appearance. He is well-developed.  ?HENT:  ?   Head: Normocephalic and atraumatic.  ?   Right Ear: Tympanic membrane, ear canal and external ear normal.  ?   Left Ear: Tympanic membrane, ear canal and external ear normal.  ?   Nose: Nose normal.  ?   Mouth/Throat:  ?   Mouth: Mucous membranes are moist.  ?   Pharynx: Oropharynx is clear.  ?Eyes:  ?   Extraocular Movements: Extraocular movements intact.  ?   Conjunctiva/sclera: Conjunctivae normal.  ?   Pupils: Pupils are equal, round, and reactive to light.  ?Cardiovascular:  ?   Rate and Rhythm: Normal rate and regular rhythm.  ?   Pulses: Normal pulses.  ?   Heart sounds: Normal heart sounds.  ?Pulmonary:  ?   Effort: Pulmonary effort is normal.  ?   Breath sounds: Normal breath sounds.  ?Abdominal:  ?   General: Bowel sounds are normal. There is no distension.  ?   Palpations: Abdomen is soft. There is no mass.  ?   Tenderness: There is no abdominal tenderness. There is no right CVA tenderness, left CVA tenderness, guarding or rebound.  ?   Hernia: No hernia is present.  ?Musculoskeletal:     ?   General: Normal  range of motion.  ?   Cervical back: Normal range of motion and neck supple.  ?Lymphadenopathy:  ?   Cervical: No cervical adenopathy.  ?Skin: ?   General: Skin is warm and dry.  ?   Capillary Refill: Capillary refill takes less than 2 seconds.  ?Neurological:  ?   General: No focal deficit present.  ?   Mental Status: He is alert and oriented to person, place, and time.  ?Psychiatric:     ?   Mood and Affect: Mood normal.     ?   Behavior: Behavior normal.     ?  Thought Content: Thought content normal.     ?   Judgment: Judgment normal.  ?  ? ? Assessment & Plan  ?  ?1. Encounter for general adult medical examination with abnormal findings ?Annual health maintenance exam today  ? ?2. Acute migraine ?Relpax has been effective. Will continue. Use as needed and a prescribed  ? ?3. Mixed hyperlipidemia ?Reviewed labs showing mild elevation of lipid panel. Recommend patient limit intake of fried and fatty foods. He should increase intake of lean proteins and green leafy vegetables. Adding exercise into daily routine will also be beneficial.   ? ?4. Moderate recurrent major depression ([place]) ?Continue regular visits with psychiatry and therapist as scheduled.  ? ?5. Body mass index (BMI) of 33.0-33.9 in adult ?Encourage patient to limit calorie intake to 2000 cal/day or less.  He should consume a low cholesterol, low-fat diet.   ? ?6. Need for Tdap vaccination ?Tdap vaccine administered during today's visit.   ?- Tdap vaccine greater than or equal to 7yo IM  ? ? ?Problem List Items Addressed This Visit   ? ?  ? Cardiovascular and Mediastinum  ? Acute migraine  ?  ? Other  ? Moderate recurrent major depression ([place])  ? Body mass index (BMI) of 33.0-33.9 in adult  ? Mixed hyperlipidemia  ? ?Other Visit Diagnoses   ? ? Encounter for general adult medical examination with abnormal findings    -  Primary  ? Need for Tdap vaccination      ? Relevant Orders  ? Tdap vaccine greater than or equal to 7yo IM (Completed)  ? ?  ?   ? ?Return in about 6 months (around 08/15/2022) for headaches .  ?   ? ? ? ? ?Ronnell Freshwater, NP  ?[facility] ?[phone] (phone) ?[phone] (fax) ? ?[place]

## 2022-02-14 ENCOUNTER — Other Ambulatory Visit (HOSPITAL_COMMUNITY): Payer: Self-pay

## 2022-02-14 DIAGNOSIS — E782 Mixed hyperlipidemia: Secondary | ICD-10-CM | POA: Insufficient documentation

## 2022-02-15 ENCOUNTER — Other Ambulatory Visit (HOSPITAL_COMMUNITY): Payer: Self-pay

## 2022-02-15 MED ORDER — ALBUTEROL SULFATE HFA 108 (90 BASE) MCG/ACT IN AERS
2.0000 | INHALATION_SPRAY | RESPIRATORY_TRACT | 0 refills | Status: DC
  Filled 2022-02-15: qty 18, 17d supply, fill #0

## 2022-02-27 ENCOUNTER — Ambulatory Visit (INDEPENDENT_AMBULATORY_CARE_PROVIDER_SITE_OTHER): Payer: 59 | Admitting: Mental Health

## 2022-02-27 DIAGNOSIS — F331 Major depressive disorder, recurrent, moderate: Secondary | ICD-10-CM | POA: Diagnosis not present

## 2022-02-27 NOTE — Progress Notes (Signed)
[facility] Counsel[facility] psychotherapy Note ? ?Name: David Sweeney ?Date: 02/27/22 ?MRN: [id number] ?DOB: 05-13-2001 ?PCP: David Freshwater, NP ? ?Time spent: 51 minutes ? ?Treatment:  Individual therapy ? ?Virtual Visit via Telehealth Note ?Connected with patient by a telemedicine/telehealth application, with their informed consent, and verified patient privacy and that I am speaking with the correct person using two identifiers. I discussed the limitations, risks, security and privacy concerns of performing psychotherapy and the availability of in person appointments. I also discussed with the patient that there may be a patient responsible charge related to this service. The patient expressed understanding and agreed to proceed. ?I discussed the treatment planning with the patient. The patient was provided an opportunity to ask questions and all were answered. The patient agreed with the plan and demonstrated an understanding of the instructions. The patient was advised to call  our office if  symptoms worsen or feel they are in a crisis state and need immediate contact. ?  ?Therapist Location: office ?Patient Location: home ?  ? ? ?Mental Status Exam: ?   ?Appearance:    Casual     ?Behavior:   Appropriate  ?Motor:   WNL  ?Speech/Language:    Clear and Coherent  ?Affect:   Full range   ?Mood:   Euthymic  ?Thought process:   normal  ?Thought content:     WNL  ?Sensory/Perceptual disturbances:     none  ?Orientation:   x4  ?Attention:   Good  ?Concentration:   Good  ?Memory:   Intact  ?Fund of knowledge:    Consistent with age and development  ?Insight:     Good  ?Judgment:    Good  ?Impulse Control:   Good  ?  ? ?Reported Symptoms:  anxiety, neg self talk, stress-eating, "overly self conscious". ? ?Risk Assessment: ?Danger to Self:  No ?Self-injurious Behavior: No ?Danger to Others: No ?Duty to Warn:no ?Physical Aggression / Violence:No  ?Access to Firearms a concern: No  ?Gang Involvement:No  ?Patient / guardian was  educated about steps to take if suicide or homicide risk level increases between visits: yes ?While future psychiatric events cannot be accurately predicted, the patient does not currently require acute inpatient psychiatric care and does not currently meet [facility] involuntary commitment criteria. ? ? ? ? ?Medications: ?Current Outpatient Medications  ?Medication Sig Dispense Refill  ? albuterol (VENTOLIN HFA) 108 (90 Base) MCG/ACT inhaler INHALE 2 PUFFS BY MOUTH EVERY 4 TO 6 HOURS AS NEEDED FOR COUGH AND WHEEZE 18 g 0  ? albuterol (VENTOLIN HFA) 108 (90 Base) MCG/ACT inhaler Inhale 2 puffs into the lungs every 4-6 hours as needed for cough/wheeze 18 g 0  ? brexpiprazole (REXULTI) 2 MG TABS tablet Take 1 tablet (2 mg total) by mouth every other day for 14 days, then take 1 tablet (48m) by mouth daily. 30 tablet 2  ? cetirizine (ZYRTEC) 10 MG tablet Take 10 mg by mouth daily.    ? fluticasone (FLONASE) 50 MCG/ACT nasal spray Place 1 spray into both nostrils daily. 16 g 5  ? fluticasone (FLOVENT HFA) 110 MCG/ACT inhaler INHALE 2 PUFFS BY MOUTH INTO THE LUNGS TWICE A DAY 12 g 5  ? fluticasone (FLOVENT HFA) 110 MCG/ACT inhaler Inhale 2 puffs into the lungs 2 (two) times daily. 12 g 5  ? rizatriptan (MAXALT) 10 MG tablet Take 1 tablet (10 mg total) by mouth as needed for migraine. May repeat in 2 hours if needed 10 tablet 1  ?  sertraline (ZOLOFT) 100 MG tablet Take 1 tablet (100 mg total) by mouth daily. 90 tablet 0  ? ?No current facility-administered medications for this visit.  ? ?Subjective:  ?Patient engages in session via telephone. He has been helping his parent a lot lately with tasks at home. ?He recently renewed his drivers permit last week; he plans to practicing when one of the family cars becomes available. He plans to look for employment increasingly when he obtains the license. Wants to improve his GPA, attend community college, possibly earn a trade degree. Continues to report anxiety socially,  engages with others online primarily. Met someone recently he considers a new friend recently and how it continues to go well.  ?Reports depression has been lower, denies SI. Reports lately, he has been trying to moderate his use of alcohol, typically uses about every other day a couple of drinks ?He stated he has realized being social with others online sometimes feels less stressful when drinking.  Discussed some cautions about frequent drinking cycles that can develop, drawbacks.  He stated that he wants to meet others in the local area to form more local friendships.  He plans to follow up with some potential social groups with similar interests after researching online to take further steps socially for himself. ? ? ? ?Intervention: CBT, supportive therapy, motivational interviewing ? ? ?Diagnoses:  ?  ICD-10-CM   ?1. Moderate recurrent major depression ([place])  F33.1   ?  ? ? ? ?Plan: Patient is to use CBT, mindfulness and coping skills to help manage stress,, and identify outlets for stress management, relaxation.   ? ? ?Long-term goal:   ?Reduce overall level, frequency, and intensity of the feelings of emotional distress that inadvertently affects patient's mood, functioning and relationships for at least 3 consecutive months oer patient report.  ?  ?Short-term goal:  ?Decrease anxiety producing self talk such as thinking of the worse possible life outcome ?Increase assertiveness with his communication to family members to elicit more help with day-to-day chores and tasks as needed ?  ?Assessment of progress:  progressing ? ? ?This record has been created using [employer].  Chart creation errors have been sought, but may not always have been located and corrected. Such creation errors do not reflect on the standard of medical care.  ? ? ?[place], [facility]  ? ? ? ?

## 2022-03-05 ENCOUNTER — Other Ambulatory Visit (HOSPITAL_COMMUNITY): Payer: Self-pay

## 2022-03-07 ENCOUNTER — Other Ambulatory Visit (HOSPITAL_COMMUNITY): Payer: Self-pay

## 2022-03-13 ENCOUNTER — Ambulatory Visit: Payer: 59 | Admitting: Mental Health

## 2022-03-13 DIAGNOSIS — F331 Major depressive disorder, recurrent, moderate: Secondary | ICD-10-CM

## 2022-03-13 NOTE — Progress Notes (Signed)
[facility] Counsel[facility] psychotherapy Note ? ?Name: David Sweeney ?Date: 02/27/22 ?MRN: [id number] ?DOB: 05-13-2001 ?PCP: Ronnell Freshwater, NP ? ?Time spent: 51 minutes ? ?Treatment:  Individual therapy ? ?Virtual Visit via Telehealth Note ?Connected with patient by a telemedicine/telehealth application, with their informed consent, and verified patient privacy and that I am speaking with the correct person using two identifiers. I discussed the limitations, risks, security and privacy concerns of performing psychotherapy and the availability of in person appointments. I also discussed with the patient that there may be a patient responsible charge related to this service. The patient expressed understanding and agreed to proceed. ?I discussed the treatment planning with the patient. The patient was provided an opportunity to ask questions and all were answered. The patient agreed with the plan and demonstrated an understanding of the instructions. The patient was advised to call  our office if  symptoms worsen or feel they are in a crisis state and need immediate contact. ?  ?Therapist Location: office ?Patient Location: home ?  ? ? ?Mental Status Exam: ?   ?Appearance:    Casual     ?Behavior:   Appropriate  ?Motor:   WNL  ?Speech/Language:    Clear and Coherent  ?Affect:   Full range   ?Mood:   Euthymic  ?Thought process:   normal  ?Thought content:     WNL  ?Sensory/Perceptual disturbances:     none  ?Orientation:   x4  ?Attention:   Good  ?Concentration:   Good  ?Memory:   Intact  ?Fund of knowledge:    Consistent with age and development  ?Insight:     Good  ?Judgment:    Good  ?Impulse Control:   Good  ?  ? ?Reported Symptoms:  anxiety, neg self talk, stress-eating, "overly self conscious". ? ?Risk Assessment: ?Danger to Self:  No ?Self-injurious Behavior: No ?Danger to Others: No ?Duty to Warn:no ?Physical Aggression / Violence:No  ?Access to Firearms a concern: No  ?Gang Involvement:No  ?Patient / guardian was  educated about steps to take if suicide or homicide risk level increases between visits: yes ?While future psychiatric events cannot be accurately predicted, the patient does not currently require acute inpatient psychiatric care and does not currently meet [facility] involuntary commitment criteria. ? ? ? ? ?Medications: ?Current Outpatient Medications  ?Medication Sig Dispense Refill  ? albuterol (VENTOLIN HFA) 108 (90 Base) MCG/ACT inhaler INHALE 2 PUFFS BY MOUTH EVERY 4 TO 6 HOURS AS NEEDED FOR COUGH AND WHEEZE 18 g 0  ? albuterol (VENTOLIN HFA) 108 (90 Base) MCG/ACT inhaler Inhale 2 puffs into the lungs every 4-6 hours as needed for cough/wheeze 18 g 0  ? brexpiprazole (REXULTI) 2 MG TABS tablet Take 1 tablet (2 mg total) by mouth every other day for 14 days, then take 1 tablet (48m) by mouth daily. 30 tablet 2  ? cetirizine (ZYRTEC) 10 MG tablet Take 10 mg by mouth daily.    ? fluticasone (FLONASE) 50 MCG/ACT nasal spray Place 1 spray into both nostrils daily. 16 g 5  ? fluticasone (FLOVENT HFA) 110 MCG/ACT inhaler INHALE 2 PUFFS BY MOUTH INTO THE LUNGS TWICE A DAY 12 g 5  ? fluticasone (FLOVENT HFA) 110 MCG/ACT inhaler Inhale 2 puffs into the lungs 2 (two) times daily. 12 g 5  ? rizatriptan (MAXALT) 10 MG tablet Take 1 tablet (10 mg total) by mouth as needed for migraine. May repeat in 2 hours if needed 10 tablet 1  ?  sertraline (ZOLOFT) 100 MG tablet Take 1 tablet (100 mg total) by mouth daily. 90 tablet 0  ? ?No current facility-administered medications for this visit.  ? ?Subjective:  ?Patient arrived on time for today's session.  He reports feeling "stagnant" lately.  Stated that he continues to live at home with his parents, shared how he makes efforts to create meal planning for the week, grocery lists on a spreadsheet and would like his family to be more participating.  Feels at times he is not heard enough by them.  Denies any recent significant relational strain in his family relationships.   Through guided discovery, he identified the need to be more assertive with studying for his driving exam, practicing driving so he can get his license and ultimately find a job.  Stated that he is trying to make some friendships possibly find a romantic partner, however, through efforts recently with dating apps he feels he needs a break and would rather have typical social interactive experiences in meeting others.  He stated he still has his friends online he talks to most days and his ex-boyfriend as they remain friends.  Assisted him in reframing some cognitions related to his feeling more depressed recently.  Denies it being severe.  Plans to try and engage in interests as well. ? ? ?Intervention: CBT, supportive therapy, motivational interviewing ? ? ?Diagnoses:  ?  ICD-10-CM   ?1. Moderate recurrent major depression ([facility])  F33.1   ?  ? ? ? ? ?Plan: Patient is to use CBT, mindfulness and coping skills to help manage stress,and identify outlets for stress management, relaxation.   ? ? ?Long-term goal:   ?Reduce overall level, frequency, and intensity of the feelings of emotional distress that inadvertently affects patient's mood, functioning and relationships for at least 3 consecutive months oer patient report.  ?  ?Short-term goal:  ?Decrease anxiety producing self talk such as thinking of the worse possible life outcome ?Increase assertiveness with his communication to family members to elicit more help with day-to-day chores and tasks as needed ?  ?Assessment of progress:  progressing ? ? ?This record has been created using [employer].  Chart creation errors have been sought, but may not always have been located and corrected. Such creation errors do not reflect on the standard of medical care.  ? ? ?Waldron Session, [facility]  ? ? ? ?

## 2022-03-27 ENCOUNTER — Ambulatory Visit: Payer: 59 | Admitting: Mental Health

## 2022-03-27 DIAGNOSIS — F331 Major depressive disorder, recurrent, moderate: Secondary | ICD-10-CM

## 2022-03-27 NOTE — Progress Notes (Signed)
[facility] psychotherapy Note  Name: David Sweeney Date: 03/27/22 MRN: [id number] DOB: 14-Jun-2001 PCP: Ronnell Freshwater, NP  Time spent: 52 minutes  Treatment:  Individual therapy   Mental Status Exam:    Appearance:    Casual     Behavior:   Appropriate  Motor:   WNL  Speech/Language:    Clear and Coherent  Affect:   Full range   Mood:   Pleasant, anxious  Thought process:   normal  Thought content:     WNL  Sensory/Perceptual disturbances:     none  Orientation:   x4  Attention:   Good  Concentration:   Good  Memory:   Intact  Fund of knowledge:    Consistent with age and development  Insight:     Good  Judgment:    Good  Impulse Control:   Good     Reported Symptoms:  anxiety, neg self talk, stress-eating, "overly self conscious".  Risk Assessment: Danger to Self:  No Self-injurious Behavior: No Danger to Others: No Duty to Warn:no Physical Aggression / Violence:No  Access to Firearms a concern: No  Gang Involvement:No  Patient / guardian was educated about steps to take if suicide or homicide risk level increases between visits: yes While future psychiatric events cannot be accurately predicted, the patient does not currently require acute inpatient psychiatric care and does not currently meet [facility] involuntary commitment criteria.     Medications: Current Outpatient Medications  Medication Sig Dispense Refill   albuterol (VENTOLIN HFA) 108 (90 Base) MCG/ACT inhaler INHALE 2 PUFFS BY MOUTH EVERY 4 TO 6 HOURS AS NEEDED FOR COUGH AND WHEEZE 18 g 0   albuterol (VENTOLIN HFA) 108 (90 Base) MCG/ACT inhaler Inhale 2 puffs into the lungs every 4-6 hours as needed for cough/wheeze 18 g 0   brexpiprazole (REXULTI) 2 MG TABS tablet Take 1 tablet (2 mg total) by mouth every other day for 14 days, then take 1 tablet (2mg ) by mouth daily. 30 tablet 2   cetirizine (ZYRTEC) 10 MG tablet Take 10 mg by mouth daily.     fluticasone (FLONASE) 50 MCG/ACT  nasal spray Place 1 spray into both nostrils daily. 16 g 5   fluticasone (FLOVENT HFA) 110 MCG/ACT inhaler INHALE 2 PUFFS BY MOUTH INTO THE LUNGS TWICE A DAY 12 g 5   fluticasone (FLOVENT HFA) 110 MCG/ACT inhaler Inhale 2 puffs into the lungs 2 (two) times daily. 12 g 5   rizatriptan (MAXALT) 10 MG tablet Take 1 tablet (10 mg total) by mouth as needed for migraine. May repeat in 2 hours if needed 10 tablet 1   sertraline (ZOLOFT) 100 MG tablet Take 1 tablet (100 mg total) by mouth daily. 90 tablet 0   No current facility-administered medications for this visit.   Subjective:  Patient arrived on time for today's session.  Patient shared recent events, progress.  He stated that he has not made headway on learning to drive.  He stated that he is asked his family members, his sister denied wanting to take him.  He stated that he asked his mom yesterday and he is hopeful that they will be able to practice later today.  He stated he recognizes his mother has a very busy work schedule as well as his father.  Through guided discovery, he identified the need to probably do that and locate a job on line and apply; hoping that he can talk to his parents about ways they could carpool  until he gets his driver's license which at this point might take a few months.  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  Facilitated his identifying needs, assisting him in reframing thoughts throughout the session towards improving his mood.  He stated that he has made some efforts to talk to one of his friends more recently and is also started to text someone he has some romantic interest in recently.   Intervention: CBT, supportive therapy, motivational interviewing   Diagnoses:    ICD-10-CM   1. 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     Long-term goal:   Reduce overall level, frequency, and intensity of the feelings of emotional distress that inadvertently affects patient's mood, functioning and relationships for at least 3 consecutive months oer patient report.    Short-term goal:  Decrease anxiety producing self talk such as thinking of the worse possible life outcome Increase assertiveness with his communication to family members to elicit more help with day-to-day chores and tasks as needed   Assessment of progress:  progressing   This record has been created using [employer].  Chart creation errors have been sought, but may not always have been located and corrected. Such creation errors do not reflect on the standard of medical care.    Anson [place], [facility]

## 2022-04-09 ENCOUNTER — Other Ambulatory Visit (HOSPITAL_COMMUNITY): Payer: Self-pay

## 2022-04-09 ENCOUNTER — Ambulatory Visit (INDEPENDENT_AMBULATORY_CARE_PROVIDER_SITE_OTHER): Payer: 59 | Admitting: Mental Health

## 2022-04-09 DIAGNOSIS — F331 Major depressive disorder, recurrent, moderate: Secondary | ICD-10-CM

## 2022-04-09 NOTE — Progress Notes (Signed)
[facility] psychotherapy Note  Name: David Sweeney Date: 04/09/22 MRN: [id number] DOB: [birth year] PCP: Carlean Jews, NP  Time spent: 55 minutes  Treatment:  Individual therapy   Mental Status Exam:    Appearance:    Casual     Behavior:   Appropriate  Motor:   WNL  Speech/Language:    Clear and Coherent  Affect:   Full range   Mood:   Pleasant, anxious  Thought process:   normal  Thought content:     WNL  Sensory/Perceptual disturbances:     none  Orientation:   x4  Attention:   Good  Concentration:   Good  Memory:   Intact  Fund of knowledge:    Consistent with age and development  Insight:     Good  Judgment:    Good  Impulse Control:   Good     Reported Symptoms:  anxiety, neg self talk, stress-eating, "overly self conscious".  Risk Assessment: Danger to Self:  No Self-injurious Behavior: No Danger to Others: No Duty to Warn:no Physical Aggression / Violence:No  Access to Firearms a concern: No  Gang Involvement:No  Patient / guardian was educated about steps to take if suicide or homicide risk level increases between visits: yes While future psychiatric events cannot be accurately predicted, the patient does not currently require acute inpatient psychiatric care and does not currently meet [facility] involuntary commitment criteria.     Medications: Current Outpatient Medications  Medication Sig Dispense Refill   albuterol (VENTOLIN HFA) 108 (90 Base) MCG/ACT inhaler INHALE 2 PUFFS BY MOUTH EVERY 4 TO 6 HOURS AS NEEDED FOR COUGH AND WHEEZE 18 g 0   albuterol (VENTOLIN HFA) 108 (90 Base) MCG/ACT inhaler Inhale 2 puffs into the lungs every 4-6 hours as needed for cough/wheeze 18 g 0   brexpiprazole (REXULTI) 2 MG TABS tablet Take 1 tablet (2 mg total) by mouth every other day for 14 days, then take 1 tablet (2mg ) by mouth daily. 30 tablet 2   cetirizine (ZYRTEC) 10 MG tablet Take 10 mg by mouth daily.     fluticasone (FLONASE) 50 MCG/ACT  nasal spray Place 1 spray into both nostrils daily. 16 g 5   fluticasone (FLOVENT HFA) 110 MCG/ACT inhaler INHALE 2 PUFFS BY MOUTH INTO THE LUNGS TWICE A DAY 12 g 5   fluticasone (FLOVENT HFA) 110 MCG/ACT inhaler Inhale 2 puffs into the lungs 2 (two) times daily. 12 g 5   rizatriptan (MAXALT) 10 MG tablet Take 1 tablet (10 mg total) by mouth as needed for migraine. May repeat in 2 hours if needed 10 tablet 1   sertraline (ZOLOFT) 100 MG tablet Take 1 tablet (100 mg total) by mouth daily. 90 tablet 0   No current facility-administered medications for this visit.   Subjective:  Patient arrived on time for today's session.  Assessed progress toward goals as well as relevant recent events.  He stated that he continues to struggle with some motivation, reports being depressed recently which has been ongoing for the last couple of months.  Reports that he has not had an appetite over the last 2 to 3 days.  When exploring with patient had difficulty identifying reasons.  He stated that he tends to obsess about losing weight.  He stated that he is overweight currently but denies this being a factor for him not eating as much recently.  He attributes it to potentially being depressed, reports a low mood, denies it being severe.  Shared how he continues to communicate with others online, he coincided how 1 person had been talking to her for the last month discontinue talking with him and he identified this as being potential reason for his decreased appetite.  Facilitated reframing in session of thoughts associated with some disappointment in losing the potential relationship.  He stated he has taken steps to get his experience in driving and went a couple of times since our last session; reports it went well.   Intervention: CBT, supportive therapy, motivational interviewing   Diagnoses:    ICD-10-CM   1. Moderate recurrent major depression (HCC)  F33.1         Plan: Patient is to use CBT, mindfulness  and coping skills to help manage stress, and identify outlets for stress management, relaxation.     Long-term goal:   Reduce overall level, frequency, and intensity of the feelings of emotional distress that inadvertently affects patient's mood, functioning and relationships for at least 3 consecutive months oer patient report.    Short-term goal:  Decrease anxiety producing self talk such as thinking of the worse possible life outcome Increase assertiveness with his communication to family members to elicit more help with day-to-day chores and tasks as needed   Assessment of progress:  progressing   This record has been created using [employer].  Chart creation errors have been sought, but may not always have been located and corrected. Such creation errors do not reflect on the standard of medical care.    Waldron Session, [facility]

## 2022-04-19 ENCOUNTER — Encounter: Payer: Self-pay | Admitting: Psychiatry

## 2022-04-19 ENCOUNTER — Other Ambulatory Visit (HOSPITAL_COMMUNITY): Payer: Self-pay

## 2022-04-19 ENCOUNTER — Telehealth (INDEPENDENT_AMBULATORY_CARE_PROVIDER_SITE_OTHER): Payer: 59 | Admitting: Psychiatry

## 2022-04-19 DIAGNOSIS — F411 Generalized anxiety disorder: Secondary | ICD-10-CM | POA: Diagnosis not present

## 2022-04-19 DIAGNOSIS — F22 Delusional disorders: Secondary | ICD-10-CM

## 2022-04-19 DIAGNOSIS — F422 Mixed obsessional thoughts and acts: Secondary | ICD-10-CM

## 2022-04-19 DIAGNOSIS — F331 Major depressive disorder, recurrent, moderate: Secondary | ICD-10-CM

## 2022-04-19 MED ORDER — BREXPIPRAZOLE 2 MG PO TABS
2.0000 mg | ORAL_TABLET | ORAL | 0 refills | Status: DC
  Filled 2022-04-19: qty 90, 180d supply, fill #0
  Filled 2022-05-10: qty 45, 90d supply, fill #0
  Filled 2022-08-01: qty 45, 90d supply, fill #1

## 2022-04-19 MED ORDER — SERTRALINE HCL 100 MG PO TABS
100.0000 mg | ORAL_TABLET | Freq: Every day | ORAL | 0 refills | Status: DC
  Filled 2022-04-19 – 2022-08-01 (×2): qty 90, 90d supply, fill #0

## 2022-04-19 MED ORDER — BUPROPION HCL ER (XL) 150 MG PO TB24
150.0000 mg | ORAL_TABLET | Freq: Every day | ORAL | 0 refills | Status: DC
  Filled 2022-04-19: qty 90, 90d supply, fill #0

## 2022-04-19 NOTE — Progress Notes (Signed)
MARWOOD CORKILL [id number] 02/02/2001 21 y.o.  Virtual Visit via Video Note  I connected with pt @ on 04/20/22 at  4:00 PM EDT by a video enabled telemedicine application and verified that I am speaking with the correct person using two identifiers.   I discussed the limitations of evaluation and management by telemedicine and the availability of in person appointments. The patient expressed understanding and agreed to proceed.  I discussed the assessment and treatment plan with the patient. The patient was provided an opportunity to ask questions and all were answered. The patient agreed with the plan and demonstrated an understanding of the instructions.   The patient was advised to call back or seek an in-person evaluation if the symptoms worsen or if the condition fails to improve as anticipated.  I provided 25 minutes of non-face-to-face time during this encounter.  The patient was located at home.  The provider was located at [facility].   Corie Chiquito, PMHNP   Subjective:   Patient ID:  David Sweeney is a 21 y.o. (DOB 2001-07-10) male.  Chief Complaint:  Chief Complaint  Patient presents with   Depression   Medication Problem    Weight gain    HPI DRISTEN BUDZYNSKI presents for follow-up of depression and anxiety. He has been having "some bouts of depression" and has had persistent depression for the last month. He reports recent weight gain and this is affecting his self- image. He reports that his appetite and food intake have not changed. He reports that he had more weight gain after initially starting Rexulti has continued. He has had somewhat low energy and motivation. He reports difficulty with concentration. He reports that he has some anxiety and worry. Denies panic attacks. He has been sleeping "alright." He reports improved sleep cycle. He reports sleeping about 7-8 hours a night. Anhedonia. He reports some occasional passive death wishes. He denies SI.   He  has been having transportation issues. Has been dating some.   Past Psychiatric Medication Trials: Clomipramine- helpful for OCD. Caused worsening depression. Lexapro Luvox Zoloft Effexor XR Rexulti Saphris Klonopin  Review of Systems:  Review of Systems  Musculoskeletal:  Negative for gait problem.  Neurological:  Negative for tremors.       Some restless leg movements  Psychiatric/Behavioral:         Please refer to HPI    Medications: I have reviewed the patient's current medications.  Current Outpatient Medications  Medication Sig Dispense Refill   buPROPion (WELLBUTRIN XL) 150 MG 24 hr tablet Take 1 tablet (150 mg total) by mouth daily. 90 tablet 0   cetirizine (ZYRTEC) 10 MG tablet Take 10 mg by mouth daily.     fluticasone (FLONASE) 50 MCG/ACT nasal spray Place 1 spray into both nostrils daily. 16 g 5   fluticasone (FLOVENT HFA) 110 MCG/ACT inhaler Inhale 2 puffs into the lungs 2 (two) times daily. 12 g 5   albuterol (VENTOLIN HFA) 108 (90 Base) MCG/ACT inhaler INHALE 2 PUFFS BY MOUTH EVERY 4 TO 6 HOURS AS NEEDED FOR COUGH AND WHEEZE 18 g 0   albuterol (VENTOLIN HFA) 108 (90 Base) MCG/ACT inhaler Inhale 2 puffs into the lungs every 4-6 hours as needed for cough/wheeze 18 g 0   brexpiprazole (REXULTI) 2 MG TABS tablet Take 1 tablet (2 mg total) by mouth every other day. 90 tablet 0   fluticasone (FLOVENT HFA) 110 MCG/ACT inhaler INHALE 2 PUFFS BY MOUTH INTO THE LUNGS TWICE A DAY  12 g 5   rizatriptan (MAXALT) 10 MG tablet Take 1 tablet (10 mg total) by mouth as needed for migraine. May repeat in 2 hours if needed 10 tablet 1   sertraline (ZOLOFT) 100 MG tablet Take 1 tablet (100 mg total) by mouth daily. 90 tablet 0   No current facility-administered medications for this visit.    Medication Side Effects: None  Allergies: No Known Allergies  Past Medical History:  Diagnosis Date   Asthma     Family History  Problem Relation Age of Onset   ADD / ADHD Father     Hypertension Father    ADD / ADHD Sister    Depression Sister    ADD / ADHD Brother    ADD / ADHD Maternal Aunt    Depression Maternal Aunt    Alcohol abuse Paternal Aunt    Depression Paternal Aunt    ADD / ADHD Maternal Grandmother    Cancer Maternal Grandmother        colon, appendix   Depression Maternal Grandmother    Hypertension Maternal Grandmother    Diabetes Paternal Grandfather     Social History   Socioeconomic History   Marital status: Single    Spouse name: Not on file   Number of children: Not on file   Years of education: Not on file   Highest education level: Not on file  Occupational History   Not on file  Tobacco Use   Smoking status: Never   Smokeless tobacco: Never  Vaping Use   Vaping Use: Never used  Substance and Sexual Activity   Alcohol use: Yes    Alcohol/week: 2.0 standard drinks of alcohol    Types: 2 Cans of beer per week   Drug use: Not Currently    Types: Marijuana   Sexual activity: Not Currently  Other Topics Concern   Not on file  Social History Narrative   David Sweeney is in the 12th grade at [employer]; he does well in school. He lives with parents and siblings. He enjoys video games, listening to music, and hanging out with friends.    Social Determinants of Health   Financial Resource Strain: Not on file  Food Insecurity: Not on file  Transportation Needs: Not on file  Physical Activity: Not on file  Stress: Not on file  Social Connections: Not on file  Intimate Partner Violence: Not on file    Past Medical History, Surgical history, Social history, and Family history were reviewed and updated as appropriate.   Please see review of systems for further details on the patient's review from today.   Objective:   Physical Exam:  Wt 239 lb 11.2 oz (108.7 kg)   BMI 34.32 kg/m   Physical Exam Neurological:     Mental Status: He is alert and oriented to person, place, and time.     Cranial Nerves: No dysarthria.   Psychiatric:        Attention and Perception: Attention and perception normal.        Mood and Affect: Mood is depressed.        Speech: Speech normal.        Behavior: Behavior is cooperative.        Thought Content: Thought content normal. Thought content is not paranoid or delusional. Thought content does not include homicidal or suicidal ideation. Thought content does not include homicidal or suicidal plan.        Cognition and Memory: Cognition and memory normal.  Judgment: Judgment normal.     Comments: Insight intact     Lab Review:     Component Value Date/Time   NA 142 02/08/2022 0925   K 4.5 02/08/2022 0925   CL 104 02/08/2022 0925   CO2 24 02/08/2022 0925   GLUCOSE 97 02/08/2022 0925   BUN 9 02/08/2022 0925   CREATININE 0.97 02/08/2022 0925   CALCIUM 9.6 02/08/2022 0925   PROT 7.4 02/08/2022 0925   ALBUMIN 4.4 02/08/2022 0925   AST 16 02/08/2022 0925   ALT 24 02/08/2022 0925   ALKPHOS 69 02/08/2022 0925   BILITOT 0.5 02/08/2022 0925       Component Value Date/Time   WBC 6.0 02/08/2022 0925   RBC 5.67 02/08/2022 0925   HGB 16.9 02/08/2022 0925   HCT 49.4 02/08/2022 0925   PLT 188 02/08/2022 0925   MCV 87 02/08/2022 0925   MCH 29.8 02/08/2022 0925   MCHC 34.2 02/08/2022 0925   RDW 12.8 02/08/2022 0925   LYMPHSABS 2.0 02/08/2022 0925   EOSABS 0.1 02/08/2022 0925   BASOSABS 0.0 02/08/2022 0925    No results found for: "POCLITH", "LITHIUM"   No results found for: "PHENYTOIN", "PHENOBARB", "VALPROATE", "CBMZ"   .res Assessment: Plan:    Pt seen for 25 minutes and time spent discussing concerns about weight gain. He reports that weight gain seems to be a side effect with Rexulti. Discussed option of changing Rexulti to another medication or adding a medication to possibly reduce risk of weight gain. He reports that he prefers to continue Rexulti since overall benefits are outweighing side effects. Discussed potential benefits, risks, and side effects  of starting Wellbutrin XL to improve depression and for off-label indication of weight gain since Bupropion is in some combination weight loss medications. Pt agrees to trial of Wellbutrin XL 150 mg po qd.  Continue Rexulti 2 mg po qd for depression.  Continue Sertraline 100 mg po qd for anxiety and depression. Discussed that Sertraline could be increased in the future if he continues to have anxiety and depressive s/s since he reports that he no longer believes that Sertraline was cause of past sexual side effects.  Pt to follow-up in 2-3 months or sooner if clinically indicated.  Recommend continuing psychotherapy with Elio Forget, [facility].  Patient advised to contact office with any questions, adverse effects, or acute worsening in signs and symptoms.   Princetyn was seen today for depression and medication problem.  Diagnoses and all orders for this visit:  Mixed obsessional thoughts and acts -     sertraline (ZOLOFT) 100 MG tablet; Take 1 tablet (100 mg total) by mouth daily. -     brexpiprazole (REXULTI) 2 MG TABS tablet; Take 1 tablet (2 mg total) by mouth every other day.  GAD (generalized anxiety disorder) -     sertraline (ZOLOFT) 100 MG tablet; Take 1 tablet (100 mg total) by mouth daily.  Moderate recurrent major depression (HCC) -     buPROPion (WELLBUTRIN XL) 150 MG 24 hr tablet; Take 1 tablet (150 mg total) by mouth daily. -     sertraline (ZOLOFT) 100 MG tablet; Take 1 tablet (100 mg total) by mouth daily.  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     Please see After Visit Summary for patient specific instructions.  Future Appointments  Date Time  Provider Department Center  04/23/2022  2:00 PM Waldron Session, [facility] None  05/08/2022 11:00 AM Waldron Session, [facility] CP-[facility] None  05/22/2022  1:00 PM Waldron Session, [facility] CP-CP None  08/15/2022  2:10 PM Carlean Jews, NP [facility] None    No orders of the defined types were placed in this encounter.     -------------------------------

## 2022-04-23 ENCOUNTER — Ambulatory Visit (INDEPENDENT_AMBULATORY_CARE_PROVIDER_SITE_OTHER): Payer: 59 | Admitting: Mental Health

## 2022-04-23 DIAGNOSIS — F422 Mixed obsessional thoughts and acts: Secondary | ICD-10-CM | POA: Diagnosis not present

## 2022-04-23 NOTE — Progress Notes (Signed)
[facility] psychotherapy Note  Name: ELLIOT TWITE Date: 04/23/22 MRN: [id number] DOB: [birth year] PCP: Carlean Jews, NP  Time spent: 56 minutes  Treatment:  Individual therapy   Mental Status Exam:    Appearance:    Casual     Behavior:   Appropriate  Motor:   WNL  Speech/Language:    Clear and Coherent  Affect:   Full range   Mood:   Pleasant, anxious  Thought process:   normal  Thought content:     WNL  Sensory/Perceptual disturbances:     none  Orientation:   x4  Attention:   Good  Concentration:   Good  Memory:   Intact  Fund of knowledge:    Consistent with age and development  Insight:     Good  Judgment:    Good  Impulse Control:   Good     Reported Symptoms:  anxiety, neg self talk, stress-eating, "overly self conscious".  Risk Assessment: Danger to Self:  No Self-injurious Behavior: No Danger to Others: No Duty to Warn:no Physical Aggression / Violence:No  Access to Firearms a concern: No  Gang Involvement:No  Patient / guardian was educated about steps to take if suicide or homicide risk level increases between visits: yes While future psychiatric events cannot be accurately predicted, the patient does not currently require acute inpatient psychiatric care and does not currently meet [facility] involuntary commitment criteria.     Medications: Current Outpatient Medications  Medication Sig Dispense Refill   albuterol (VENTOLIN HFA) 108 (90 Base) MCG/ACT inhaler INHALE 2 PUFFS BY MOUTH EVERY 4 TO 6 HOURS AS NEEDED FOR COUGH AND WHEEZE 18 g 0   albuterol (VENTOLIN HFA) 108 (90 Base) MCG/ACT inhaler Inhale 2 puffs into the lungs every 4-6 hours as needed for cough/wheeze 18 g 0   brexpiprazole (REXULTI) 2 MG TABS tablet Take 1 tablet (2 mg total) by mouth every other day. 90 tablet 0   buPROPion (WELLBUTRIN XL) 150 MG 24 hr tablet Take 1 tablet (150 mg total) by mouth daily. 90 tablet 0   cetirizine (ZYRTEC) 10 MG tablet Take 10 mg by  mouth daily.     fluticasone (FLONASE) 50 MCG/ACT nasal spray Place 1 spray into both nostrils daily. 16 g 5   fluticasone (FLOVENT HFA) 110 MCG/ACT inhaler INHALE 2 PUFFS BY MOUTH INTO THE LUNGS TWICE A DAY 12 g 5   fluticasone (FLOVENT HFA) 110 MCG/ACT inhaler Inhale 2 puffs into the lungs 2 (two) times daily. 12 g 5   rizatriptan (MAXALT) 10 MG tablet Take 1 tablet (10 mg total) by mouth as needed for migraine. May repeat in 2 hours if needed 10 tablet 1   sertraline (ZOLOFT) 100 MG tablet Take 1 tablet (100 mg total) by mouth daily. 90 tablet 0   No current facility-administered medications for this visit.   Subjective:  Patient arrived on time for today's session.  Assessed progress, recent events with patient.  He reports that he continues to have anxiety regarding driving; plans to practice today with his mother following today's session.  Facilitated his identifying details and how he can work to try and manage his anxiety while driving.  He plans to take steps in practicing more frequently, per his parents schedule.  Also, plans to take steps toward evaluating his comfort level and confidence while driving after every practice.  Facilitated his also identifying other needs where he would like to have more friends as he stated he has  minimal other than someone mine and his ex-boyfriend.  He shared recent experience while trying to meet others online to potentially date and how it went negatively, dealing with one individual's critical comments toward him.  Patient stated he is doing better now but admits it was hurtful in the moment.  Facilitated his identifying ways to cope.  He continues to want to gain more self-confidence and also identified not wanting to be "clingy" when trying to meet others.  Explored collaboratively for the remainder of session ways he has attempted and potential ways in the future.  Intervention: CBT, supportive therapy, motivational interviewing   Diagnoses:     ICD-10-CM   1. Mixed obsessional thoughts and acts  F42.2          Plan: Patient is to use CBT, mindfulness and coping skills to help manage stress, and identify outlets for stress management, relaxation.     Long-term goal:   Reduce overall level, frequency, and intensity of the feelings of emotional distress that inadvertently affects patient's mood, functioning and relationships for at least 3 consecutive months oer patient report.    Short-term goal:  Decrease anxiety producing self talk such as thinking of the worse possible life outcome Increase assertiveness with his communication to family members to elicit more help with day-to-day chores and tasks as needed   Assessment of progress:  progressing   This record has been created using [employer].  Chart creation errors have been sought, but may not always have been located and corrected. Such creation errors do not reflect on the standard of medical care.    Waldron Session, [facility]

## 2022-05-08 ENCOUNTER — Ambulatory Visit (INDEPENDENT_AMBULATORY_CARE_PROVIDER_SITE_OTHER): Payer: 59 | Admitting: Mental Health

## 2022-05-08 DIAGNOSIS — F422 Mixed obsessional thoughts and acts: Secondary | ICD-10-CM

## 2022-05-08 NOTE — Progress Notes (Signed)
[facility] psychotherapy Note  Name: David Sweeney Date: 05/08/22 MRN: [id number] DOB: 10/29/2001 PCP: Carlean Jews, NP  Time spent: 56 minutes  Treatment:  Individual therapy   Mental Status Exam:    Appearance:    Casual     Behavior:   Appropriate  Motor:   WNL  Speech/Language:    Clear and Coherent  Affect:   Full range   Mood:   Pleasant, anxious  Thought process:   normal  Thought content:     WNL  Sensory/Perceptual disturbances:     none  Orientation:   x4  Attention:   Good  Concentration:   Good  Memory:   Intact  Fund of knowledge:    Consistent with age and development  Insight:     Good  Judgment:    Good  Impulse Control:   Good     Reported Symptoms:  anxiety, neg self talk, stress-eating, "overly self conscious".  Risk Assessment: Danger to Self:  No Self-injurious Behavior: No Danger to Others: No Duty to Warn:no Physical Aggression / Violence:No  Access to Firearms a concern: No  Gang Involvement:No  Patient / guardian was educated about steps to take if suicide or homicide risk level increases between visits: yes While future psychiatric events cannot be accurately predicted, the patient does not currently require acute inpatient psychiatric care and does not currently meet [facility] involuntary commitment criteria.     Medications: Current Outpatient Medications  Medication Sig Dispense Refill   albuterol (VENTOLIN HFA) 108 (90 Base) MCG/ACT inhaler INHALE 2 PUFFS BY MOUTH EVERY 4 TO 6 HOURS AS NEEDED FOR COUGH AND WHEEZE 18 g 0   albuterol (VENTOLIN HFA) 108 (90 Base) MCG/ACT inhaler Inhale 2 puffs into the lungs every 4-6 hours as needed for cough/wheeze 18 g 0   brexpiprazole (REXULTI) 2 MG TABS tablet Take 1 tablet (2 mg total) by mouth every other day. 90 tablet 0   buPROPion (WELLBUTRIN XL) 150 MG 24 hr tablet Take 1 tablet (150 mg total) by mouth daily. 90 tablet 0   cetirizine (ZYRTEC) 10 MG tablet Take 10 mg by  mouth daily.     fluticasone (FLONASE) 50 MCG/ACT nasal spray Place 1 spray into both nostrils daily. 16 g 5   fluticasone (FLOVENT HFA) 110 MCG/ACT inhaler INHALE 2 PUFFS BY MOUTH INTO THE LUNGS TWICE A DAY 12 g 5   fluticasone (FLOVENT HFA) 110 MCG/ACT inhaler Inhale 2 puffs into the lungs 2 (two) times daily. 12 g 5   rizatriptan (MAXALT) 10 MG tablet Take 1 tablet (10 mg total) by mouth as needed for migraine. May repeat in 2 hours if needed 10 tablet 1   sertraline (ZOLOFT) 100 MG tablet Take 1 tablet (100 mg total) by mouth daily. 90 tablet 0   No current facility-administered medications for this visit.   Subjective:  Patient arrived on time for today's session.  Progress toward goals as well as relevant recent events were discussed. Patient continues to endorse feeling depressed daily. Facilitated his identifying thoughts and experiences related where he questioned if he comes with some aspects of body dysmorphia. Exploring with patient further details, duration. Patient said he has struggled with negative self image sense around the age 41 to present, with feeling most distressed during early adolescence. He stated that he has lost some weight and attributes his medication change about one to two months ago as being helpful as one of his previous medications caused him to  gain. Explored family relationships and with peers which are primarily online. He continues to identify the need to increase his comfort with driving which will afford him more independence by finding employment.  His plan is to continue to make requests of his mother to drive with him weekly to get the practice needed.  Intervention: CBT, supportive therapy, motivational interviewing   Diagnoses:    ICD-10-CM   1. Mixed obsessional thoughts and acts  F42.2        Plan: Patient is to use CBT, mindfulness and coping skills to help manage stress, and identify outlets for stress management, relaxation.     Long-term  goal:   Reduce overall level, frequency, and intensity of the feelings of emotional distress that inadvertently affects patient's mood, functioning and relationships for at least 3 consecutive months oer patient report.    Short-term goal:  Decrease anxiety producing self talk such as thinking of the worse possible life outcome Increase assertiveness with his communication to family members to elicit more help with day-to-day chores and tasks as needed   Assessment of progress:  progressing   This record has been created using [employer].  Chart creation errors have been sought, but may not always have been located and corrected. Such creation errors do not reflect on the standard of medical care.    Waldron Session, [facility]

## 2022-05-10 ENCOUNTER — Other Ambulatory Visit (HOSPITAL_COMMUNITY): Payer: Self-pay

## 2022-05-22 ENCOUNTER — Ambulatory Visit (INDEPENDENT_AMBULATORY_CARE_PROVIDER_SITE_OTHER): Payer: 59 | Admitting: Mental Health

## 2022-05-22 DIAGNOSIS — F422 Mixed obsessional thoughts and acts: Secondary | ICD-10-CM | POA: Diagnosis not present

## 2022-05-22 NOTE — Progress Notes (Addendum)
[facility] psychotherapy Note  Name: David Sweeney Date: 05/22/22 MRN: [id number] DOB: [birth year] PCP: Carlean Jews, NP  Time spent: 56 minutes  Treatment:  Individual therapy  Virtual Visit via Telehealth Note Connected with patient by a telemedicine/telehealth application, with their informed consent, and verified patient privacy and that I am speaking with the correct person using two identifiers. I discussed the limitations, risks, security and privacy concerns of performing psychotherapy and the availability of in person appointments. I also discussed with the patient that there may be a patient responsible charge related to this service. The patient expressed understanding and agreed to proceed. I discussed the treatment planning with the patient. The patient was provided an opportunity to ask questions and all were answered. The patient agreed with the plan and demonstrated an understanding of the instructions. The patient was advised to call  our office if  symptoms worsen or feel they are in a crisis state and need immediate contact.   Therapist Location: office Patient Location: home     Mental Status Exam:    Appearance:    Casual     Behavior:   Appropriate  Motor:   WNL  Speech/Language:    Clear and Coherent  Affect:   Full range   Mood:   Pleasant, anxious  Thought process:   normal  Thought content:     WNL  Sensory/Perceptual disturbances:     none  Orientation:   x4  Attention:   Good  Concentration:   Good  Memory:   Intact  Fund of knowledge:    Consistent with age and development  Insight:     Good  Judgment:    Good  Impulse Control:   Good     Reported Symptoms:  anxiety, neg self talk, stress-eating, "overly self conscious".  Risk Assessment: Danger to Self:  No Self-injurious Behavior: No Danger to Others: No Duty to Warn:no Physical Aggression / Violence:No  Access to Firearms a concern: No  Gang Involvement:No  Patient /  guardian was educated about steps to take if suicide or homicide risk level increases between visits: yes While future psychiatric events cannot be accurately predicted, the patient does not currently require acute inpatient psychiatric care and does not currently meet [facility] involuntary commitment criteria.     Medications: Current Outpatient Medications  Medication Sig Dispense Refill   albuterol (VENTOLIN HFA) 108 (90 Base) MCG/ACT inhaler INHALE 2 PUFFS BY MOUTH EVERY 4 TO 6 HOURS AS NEEDED FOR COUGH AND WHEEZE 18 g 0   albuterol (VENTOLIN HFA) 108 (90 Base) MCG/ACT inhaler Inhale 2 puffs into the lungs every 4-6 hours as needed for cough/wheeze 18 g 0   brexpiprazole (REXULTI) 2 MG TABS tablet Take 1 tablet (2 mg total) by mouth every other day. 90 tablet 0   buPROPion (WELLBUTRIN XL) 150 MG 24 hr tablet Take 1 tablet (150 mg total) by mouth daily. 90 tablet 0   cetirizine (ZYRTEC) 10 MG tablet Take 10 mg by mouth daily.     fluticasone (FLONASE) 50 MCG/ACT nasal spray Place 1 spray into both nostrils daily. 16 g 5   fluticasone (FLOVENT HFA) 110 MCG/ACT inhaler INHALE 2 PUFFS BY MOUTH INTO THE LUNGS TWICE A DAY 12 g 5   fluticasone (FLOVENT HFA) 110 MCG/ACT inhaler Inhale 2 puffs into the lungs 2 (two) times daily. 12 g 5   rizatriptan (MAXALT) 10 MG tablet Take 1 tablet (10 mg total) by mouth as needed for migraine.  May repeat in 2 hours if needed 10 tablet 1   sertraline (ZOLOFT) 100 MG tablet Take 1 tablet (100 mg total) by mouth daily. 90 tablet 0   No current facility-administered medications for this visit.   Subjective:  Patient engaged in telehealth session via video.  Assess progress toward goals as well as relevant recent events.  He shared how he has felt depressed recently, having some passive SI (denies any plan or intent to harm self) recently related to his not being able to keep with keeping the house clean.  He stated that his mother will come home and at times  expressed disappointment.  He stated that he makes efforts to keep the house clean however, his brother and sister are not that helpful, even when they bears some responsibility.  He states he understands and acknowledges that his emotional reaction being excessive.  Through guided discovery and an exploration, patient connected associated feelings of low self-worth.  Facilitated patient sharing more relevant history and reframing thoughts, reviewing their impact on mood.  Reports having intrusive thoughts that can affect his self worth.  Plan to also further discuss body dysmorphia.  Intervention: CBT, supportive therapy, motivational interviewing   Diagnoses:    ICD-10-CM   1. Mixed obsessional thoughts and acts  F42.2         Plan: Patient is to use CBT, mindfulness and coping skills to help manage stress, and identify outlets for stress management, relaxation.     Long-term goal:   Reduce overall level, frequency, and intensity of the feelings of emotional distress that inadvertently affects patient's mood, functioning and relationships for at least 3 consecutive months oer patient report.    Short-term goal:  Decrease anxiety producing self talk such as thinking of the worse possible life outcome Increase assertiveness with his communication to family members to elicit more help with day-to-day chores and tasks as needed   Assessment of progress:  progressing   This record has been created using [employer].  Chart creation errors have been sought, but may not always have been located and corrected. Such creation errors do not reflect on the standard of medical care.    Waldron Session, [facility]

## 2022-06-05 ENCOUNTER — Ambulatory Visit (INDEPENDENT_AMBULATORY_CARE_PROVIDER_SITE_OTHER): Payer: 59 | Admitting: Mental Health

## 2022-06-05 DIAGNOSIS — F422 Mixed obsessional thoughts and acts: Secondary | ICD-10-CM | POA: Diagnosis not present

## 2022-06-05 NOTE — Progress Notes (Signed)
[facility] psychotherapy Note  Name: David Sweeney Date: 06/05/22 MRN: [id number] DOB: 2001/01/15 PCP: Carlean Jews, NP  Time spent: 55 minutes  Treatment:  Individual therapy      Mental Status Exam:    Appearance:    Casual     Behavior:   Appropriate  Motor:   WNL  Speech/Language:    Clear and Coherent  Affect:   Full range   Mood:   Pleasant, anxious  Thought process:   normal  Thought content:     WNL  Sensory/Perceptual disturbances:     none  Orientation:   x4  Attention:   Good  Concentration:   Good  Memory:   Intact  Fund of knowledge:    Consistent with age and development  Insight:     Good  Judgment:    Good  Impulse Control:   Good     Reported Symptoms:  anxiety, neg self talk, stress-eating, "overly self conscious".  Risk Assessment: Danger to Self:  No Self-injurious Behavior: No Danger to Others: No Duty to Warn:no Physical Aggression / Violence:No  Access to Firearms a concern: No  Gang Involvement:No  Patient / guardian was educated about steps to take if suicide or homicide risk level increases between visits: yes While future psychiatric events cannot be accurately predicted, the patient does not currently require acute inpatient psychiatric care and does not currently meet [facility] involuntary commitment criteria.     Medications: Current Outpatient Medications  Medication Sig Dispense Refill   albuterol (VENTOLIN HFA) 108 (90 Base) MCG/ACT inhaler INHALE 2 PUFFS BY MOUTH EVERY 4 TO 6 HOURS AS NEEDED FOR COUGH AND WHEEZE 18 g 0   albuterol (VENTOLIN HFA) 108 (90 Base) MCG/ACT inhaler Inhale 2 puffs into the lungs every 4-6 hours as needed for cough/wheeze 18 g 0   brexpiprazole (REXULTI) 2 MG TABS tablet Take 1 tablet (2 mg total) by mouth every other day. 90 tablet 0   buPROPion (WELLBUTRIN XL) 150 MG 24 hr tablet Take 1 tablet (150 mg total) by mouth daily. 90 tablet 0   cetirizine (ZYRTEC) 10 MG tablet Take 10 mg  by mouth daily.     fluticasone (FLONASE) 50 MCG/ACT nasal spray Place 1 spray into both nostrils daily. 16 g 5   fluticasone (FLOVENT HFA) 110 MCG/ACT inhaler INHALE 2 PUFFS BY MOUTH INTO THE LUNGS TWICE A DAY 12 g 5   fluticasone (FLOVENT HFA) 110 MCG/ACT inhaler Inhale 2 puffs into the lungs 2 (two) times daily. 12 g 5   rizatriptan (MAXALT) 10 MG tablet Take 1 tablet (10 mg total) by mouth as needed for migraine. May repeat in 2 hours if needed 10 tablet 1   sertraline (ZOLOFT) 100 MG tablet Take 1 tablet (100 mg total) by mouth daily. 90 tablet 0   No current facility-administered medications for this visit.   Subjective:  Patient presented on time for today's session.  Assess progress.  Patient stated he continues to have intrusive thoughts.  Explored with patient frequency and further details related.  He stated that he has some thoughts that can be intrusive that he considers disturbing, did not go into further detail when asked.  He shared how his morals and values were not consistent with what his intrusive thoughts might reflect.  Worked with patient from a cognitive behavioral framework, facilitating his identifying negative self talk which was in part related to feeling that he is "not enough" to others.  He continues  to feel this way related to the house not being clean enough, denies that he is given too much grief by his parents however, states he puts this pressure more on himself.  Assisted him in reframing these thoughts toward more self affirming and evidence-based.  Reviewed the impact on mood and the importance of being mindful of when he starts to feel distressed, sad and focusing on thoughts associated which increased the distress and thoughts that are more calming and reassuring. He now is looking for employment for remote work at home, is planning to continue the process albeit has been difficult recently.  He continues to take steps in learning how to drive once every few weeks  but feels he is making progress.  Intervention: CBT, supportive therapy, motivational interviewing   Diagnoses:    ICD-10-CM   1. Mixed obsessional thoughts and acts  F42.2          Plan: Patient is to use CBT, mindfulness and coping skills to help manage stress, and identify outlets for stress management, relaxation.     Long-term goal:   Reduce overall level, frequency, and intensity of the feelings of emotional distress that inadvertently affects patient's mood, functioning and relationships for at least 3 consecutive months oer patient report.    Short-term goal:  Decrease anxiety producing self talk such as thinking of the worse possible life outcome Increase assertiveness with his communication to family members to elicit more help with day-to-day chores and tasks as needed   Assessment of progress:  progressing   This record has been created using [employer].  Chart creation errors have been sought, but may not always have been located and corrected. Such creation errors do not reflect on the standard of medical care.    Waldron Session, [facility]

## 2022-06-19 ENCOUNTER — Ambulatory Visit: Payer: 59 | Admitting: Mental Health

## 2022-07-19 ENCOUNTER — Ambulatory Visit: Payer: 59 | Admitting: Mental Health

## 2022-07-19 DIAGNOSIS — F422 Mixed obsessional thoughts and acts: Secondary | ICD-10-CM | POA: Diagnosis not present

## 2022-07-19 NOTE — Progress Notes (Signed)
[facility] psychotherapy Note  Name: David Sweeney Date: 06/2022 MRN: [id number] DOB: [birth year] PCP: Ronnell Freshwater, NP  Time spent: 55 minutes  Treatment:  Individual therapy      Mental Status Exam:    Appearance:    Casual     Behavior:   Appropriate  Motor:   WNL  Speech/Language:    Clear and Coherent  Affect:   Full range   Mood:   Pleasant, anxious  Thought process:   normal  Thought content:     WNL  Sensory/Perceptual disturbances:     none  Orientation:   x4  Attention:   Good  Concentration:   Good  Memory:   Intact  Fund of knowledge:    Consistent with age and development  Insight:     Good  Judgment:    Good  Impulse Control:   Good     Reported Symptoms:  anxiety, neg self talk, stress-eating, "overly self conscious".  Risk Assessment: Danger to Self:  No Self-injurious Behavior: No Danger to Others: No Duty to Warn:no Physical Aggression / Violence:No  Access to Firearms a concern: No  Gang Involvement:No  Patient / guardian was educated about steps to take if suicide or homicide risk level increases between visits: yes While future psychiatric events cannot be accurately predicted, the patient does not currently require acute inpatient psychiatric care and does not currently meet [facility] involuntary commitment criteria.     Medications: Current Outpatient Medications  Medication Sig Dispense Refill   albuterol (VENTOLIN HFA) 108 (90 Base) MCG/ACT inhaler INHALE 2 PUFFS BY MOUTH EVERY 4 TO 6 HOURS AS NEEDED FOR COUGH AND WHEEZE 18 g 0   albuterol (VENTOLIN HFA) 108 (90 Base) MCG/ACT inhaler Inhale 2 puffs into the lungs every 4-6 hours as needed for cough/wheeze 18 g 0   brexpiprazole (REXULTI) 2 MG TABS tablet Take 1 tablet (2 mg total) by mouth every other day. 90 tablet 0   buPROPion (WELLBUTRIN XL) 150 MG 24 hr tablet Take 1 tablet (150 mg total) by mouth daily. 90 tablet 0   cetirizine (ZYRTEC) 10 MG tablet Take 10 mg  by mouth daily.     fluticasone (FLONASE) 50 MCG/ACT nasal spray Place 1 spray into both nostrils daily. 16 g 5   fluticasone (FLOVENT HFA) 110 MCG/ACT inhaler INHALE 2 PUFFS BY MOUTH INTO THE LUNGS TWICE A DAY 12 g 5   fluticasone (FLOVENT HFA) 110 MCG/ACT inhaler Inhale 2 puffs into the lungs 2 (two) times daily. 12 g 5   rizatriptan (MAXALT) 10 MG tablet Take 1 tablet (10 mg total) by mouth as needed for migraine. May repeat in 2 hours if needed 10 tablet 1   sertraline (ZOLOFT) 100 MG tablet Take 1 tablet (100 mg total) by mouth daily. 90 tablet 0   No current facility-administered medications for this visit.   Subjective:  Patient presented on time for today's session.  Assess progress events since last visit which was about 2 months ago.  Patient shared how he has attempted to find employment, going on a couple of job interviews however, did not get selected for the positions.  Patient stated he is trying to stay motivated and optimistic.  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  He also has been attempting to learn how to drive when possible as his mother is the only person in the family who has time to take him to practice.  He stated that he has now  talking to a girl that he has interest in for the past month.  He still endorses some feelings of depression some hopeless feelings at times but reports it has gotten less intense in part due to the new relationship.  Explored the positive impact this has had for him thus far, facilitating his identifying further self insight about what it means to have someone show interest.  Other family issues, relationships were discussed.   Intervention: CBT, supportive therapy, motivational interviewing   Diagnoses:    ICD-10-CM   1. Mixed obsessional thoughts and acts  F42.2           Plan: Patient is to use CBT, mindfulness and coping skills to help manage stress, and identify outlets for stress management, relaxation.     Long-term goal:    Reduce overall level, frequency, and intensity of the feelings of emotional distress that inadvertently affects patient's mood, functioning and relationships for at least 3 consecutive months oer patient report.    Short-term goal:  Decrease anxiety producing self talk such as thinking of the worse possible life outcome Increase assertiveness with his communication to family members to elicit more help with day-to-day chores and tasks as needed   Assessment of progress:  progressing   This record has been created using [employer].  Chart creation errors have been sought, but may not always have been located and corrected. Such creation errors do not reflect on the standard of medical care.    Anson [place], [facility]

## 2022-08-01 ENCOUNTER — Other Ambulatory Visit (HOSPITAL_COMMUNITY): Payer: Self-pay

## 2022-08-01 ENCOUNTER — Other Ambulatory Visit: Payer: Self-pay | Admitting: Psychiatry

## 2022-08-01 DIAGNOSIS — F331 Major depressive disorder, recurrent, moderate: Secondary | ICD-10-CM

## 2022-08-01 MED ORDER — ALBUTEROL SULFATE HFA 108 (90 BASE) MCG/ACT IN AERS
2.0000 | INHALATION_SPRAY | RESPIRATORY_TRACT | 0 refills | Status: DC
  Filled 2022-08-01: qty 6.7, 17d supply, fill #0

## 2022-08-01 MED ORDER — FLUTICASONE PROPIONATE HFA 110 MCG/ACT IN AERO
2.0000 | INHALATION_SPRAY | Freq: Two times a day (BID) | RESPIRATORY_TRACT | 1 refills | Status: AC
  Filled 2022-11-09: qty 12, 30d supply, fill #0

## 2022-08-01 MED ORDER — BUPROPION HCL ER (XL) 150 MG PO TB24
150.0000 mg | ORAL_TABLET | Freq: Every day | ORAL | 0 refills | Status: DC
  Filled 2022-08-01: qty 90, 90d supply, fill #0

## 2022-08-01 NOTE — Telephone Encounter (Signed)
Please call patient to schedule an appt.-

## 2022-08-14 ENCOUNTER — Other Ambulatory Visit (HOSPITAL_COMMUNITY): Payer: Self-pay

## 2022-08-15 ENCOUNTER — Encounter: Payer: Self-pay | Admitting: Nurse Practitioner

## 2022-08-15 ENCOUNTER — Ambulatory Visit (INDEPENDENT_AMBULATORY_CARE_PROVIDER_SITE_OTHER): Payer: 59 | Admitting: Nurse Practitioner

## 2022-08-15 VITALS — BP 121/73 | HR 73 | Ht 70.8 in | Wt 237.8 lb

## 2022-08-15 DIAGNOSIS — G43909 Migraine, unspecified, not intractable, without status migrainosus: Secondary | ICD-10-CM

## 2022-08-15 DIAGNOSIS — F331 Major depressive disorder, recurrent, moderate: Secondary | ICD-10-CM

## 2022-08-15 DIAGNOSIS — Z6839 Body mass index (BMI) 39.0-39.9, adult: Secondary | ICD-10-CM

## 2022-08-15 NOTE — Progress Notes (Signed)
Established patient visit   Patient: David Sweeney   DOB: 07-Aug-2001   21 y.o. Male  MRN: [id number] Visit Date: 08/15/2022   Chief Complaint  Patient presents with   Follow-up   Subjective    HPI  Follow up  -migraine headaches -maxalt historically helpful.  -states that he is having overall improvement in frequency and severity of migraine headaches . -he states that he does not need refills for this today  -no new concerns or complaints.    Medications: Outpatient Medications Prior to Visit  Medication Sig   albuterol (VENTOLIN HFA) 108 (90 Base) MCG/ACT inhaler Inhale 2 puffs into the lungs every 4-6 hours as needed for cough/wheeze   brexpiprazole (REXULTI) 2 MG TABS tablet Take 1 tablet (2 mg total) by mouth every other day.   buPROPion (WELLBUTRIN XL) 150 MG 24 hr tablet Take 1 tablet (150 mg total) by mouth daily.   cetirizine (ZYRTEC) 10 MG tablet Take 10 mg by mouth daily.   fluticasone (FLONASE) 50 MCG/ACT nasal spray Place 1 spray into both nostrils daily.   fluticasone (FLOVENT HFA) 110 MCG/ACT inhaler Inhale 2 puffs into the lungs 2 (two) times daily.   FLUZONE QUADRIVALENT 0.5 ML injection    rizatriptan (MAXALT) 10 MG tablet Take 1 tablet (10 mg total) by mouth as needed for migraine. May repeat in 2 hours if needed   sertraline (ZOLOFT) 100 MG tablet Take 1 tablet (100 mg total) by mouth daily.   [DISCONTINUED] albuterol (VENTOLIN HFA) 108 (90 Base) MCG/ACT inhaler INHALE 2 PUFFS BY MOUTH EVERY 4 TO 6 HOURS AS NEEDED FOR COUGH AND WHEEZE   [DISCONTINUED] fluticasone (FLOVENT HFA) 110 MCG/ACT inhaler INHALE 2 PUFFS BY MOUTH INTO THE LUNGS TWICE A DAY   [DISCONTINUED] fluticasone (FLOVENT HFA) 110 MCG/ACT inhaler Inhale 2 puffs into the lungs 2 (two) times daily.   No facility-administered medications prior to visit.    Review of Systems  Constitutional:  Negative for activity change, chills, fatigue and fever.  HENT:  Negative for congestion, postnasal drip,  rhinorrhea, sinus pressure, sinus pain, sneezing and sore throat.   Eyes: Negative.   Respiratory:  Negative for cough, shortness of breath and wheezing.   Cardiovascular:  Negative for chest pain and palpitations.  Gastrointestinal:  Negative for constipation, diarrhea, nausea and vomiting.  Endocrine: Negative for cold intolerance, heat intolerance, polydipsia and polyuria.  Genitourinary:  Negative for dysuria, frequency and urgency.  Musculoskeletal:  Negative for back pain and myalgias.  Skin:  Negative for rash.  Allergic/Immunologic: Negative for environmental allergies.  Neurological:  Positive for headaches. Negative for dizziness and weakness.       Improved frequency and severity of migraine headaches   Psychiatric/Behavioral:  The patient is not nervous/anxious.        Sees psychiatrist on routine basis        Objective     Today's Vitals   08/15/22 1421  BP: 121/73  Pulse: 73  SpO2: 99%  Weight: 237 lb 12.8 oz (107.9 kg)  Height: 5' 10.8" (1.798 m)   Body mass index is 33.35 kg/m.  BP Readings from Last 3 Encounters:  08/15/22 121/73  02/13/22 106/67  01/09/22 112/67    Wt Readings from Last 3 Encounters:  08/15/22 237 lb 12.8 oz (107.9 kg)  02/13/22 234 lb 3.2 oz (106.2 kg)  01/09/22 236 lb 12.8 oz (107.4 kg)    Physical Exam Vitals and nursing note reviewed.  Constitutional:      Appearance:  Normal appearance. He is well-developed.  HENT:     Head: Normocephalic and atraumatic.     Nose: Nose normal.     Mouth/Throat:     Mouth: Mucous membranes are moist.     Pharynx: Oropharynx is clear.  Eyes:     Conjunctiva/sclera: Conjunctivae normal.     Pupils: Pupils are equal, round, and reactive to light.  Cardiovascular:     Rate and Rhythm: Normal rate and regular rhythm.     Pulses: Normal pulses.     Heart sounds: Normal heart sounds.  Pulmonary:     Effort: Pulmonary effort is normal.     Breath sounds: Normal breath sounds.  Abdominal:      Palpations: Abdomen is soft.  Musculoskeletal:        General: Normal range of motion.     Cervical back: Normal range of motion and neck supple.  Lymphadenopathy:     Cervical: No cervical adenopathy.  Skin:    General: Skin is warm and dry.     Capillary Refill: Capillary refill takes less than 2 seconds.  Neurological:     General: No focal deficit present.     Mental Status: He is alert and oriented to person, place, and time.  Psychiatric:        Mood and Affect: Mood normal.        Behavior: Behavior normal.        Thought Content: Thought content normal.        Judgment: Judgment normal.      Assessment & Plan    1. Acute migraine Improved frequency and severity of migraine headaches. Continue to take maxalt as needed and as previously prescribed   2. Moderate recurrent major depression (HCC) [place] regular visits with psychiatry as scheduled.   3. BMI 39.0-39.9,adult Discussed lowering calorie intake to 1500 calories per day and incorporating exercise into daily routine to help lose weight.    Problem List Items Addressed This Visit       Cardiovascular and Mediastinum   Acute migraine - Primary     Other   Moderate recurrent major depression (HCC)   BMI 39.0-39.9,adult     Return in about 6 months (around 02/14/2023) for health maintenance exam, FBW a week prior to visit.         Carlean Jews, NP  [facility] at [facility] [phone] (phone) [phone] (fax)  [facility]

## 2022-08-20 ENCOUNTER — Ambulatory Visit: Payer: 59 | Admitting: Mental Health

## 2022-08-20 DIAGNOSIS — F331 Major depressive disorder, recurrent, moderate: Secondary | ICD-10-CM

## 2022-08-20 NOTE — Progress Notes (Signed)
[facility] psychotherapy Note  Name: David Sweeney Date: 08/20/22 MRN: [id number] DOB: 23-Aug-2001 PCP: Ronnell Freshwater, NP  Time spent: 54 minutes  Treatment:  Individual therapy      Mental Status Exam:    Appearance:    Casual     Behavior:   Appropriate  Motor:   WNL  Speech/Language:    Clear and Coherent  Affect:   Full range   Mood:   Pleasant, anxious  Thought process:   normal  Thought content:     WNL  Sensory/Perceptual disturbances:     none  Orientation:   x4  Attention:   Good  Concentration:   Good  Memory:   Intact  Fund of knowledge:    Consistent with age and development  Insight:     Good  Judgment:    Good  Impulse Control:   Good     Reported Symptoms:  anxiety, neg self talk, stress-eating, "overly self conscious".  Risk Assessment: Danger to Self:  No Self-injurious Behavior: No Danger to Others: No Duty to Warn:no Physical Aggression / Violence:No  Access to Firearms a concern: No  Gang Involvement:No  Patient / guardian was educated about steps to take if suicide or homicide risk level increases between visits: yes While future psychiatric events cannot be accurately predicted, the patient does not currently require acute inpatient psychiatric care and does not currently meet [facility] involuntary commitment criteria.     Medications: Current Outpatient Medications  Medication Sig Dispense Refill   albuterol (VENTOLIN HFA) 108 (90 Base) MCG/ACT inhaler Inhale 2 puffs into the lungs every 4-6 hours as needed for cough/wheeze 6.7 g 0   brexpiprazole (REXULTI) 2 MG TABS tablet Take 1 tablet (2 mg total) by mouth every other day. 90 tablet 0   buPROPion (WELLBUTRIN XL) 150 MG 24 hr tablet Take 1 tablet (150 mg total) by mouth daily. 90 tablet 0   cetirizine (ZYRTEC) 10 MG tablet Take 10 mg by mouth daily.     fluticasone (FLONASE) 50 MCG/ACT nasal spray Place 1 spray into both nostrils daily. 16 g 5   fluticasone (FLOVENT  HFA) 110 MCG/ACT inhaler Inhale 2 puffs into the lungs 2 (two) times daily. 12 g 1   FLUZONE QUADRIVALENT 0.5 ML injection      rizatriptan (MAXALT) 10 MG tablet Take 1 tablet (10 mg total) by mouth as needed for migraine. May repeat in 2 hours if needed 10 tablet 1   sertraline (ZOLOFT) 100 MG tablet Take 1 tablet (100 mg total) by mouth daily. 90 tablet 0   No current facility-administered medications for this visit.   Subjective:  Patient presented on time for today's session.  Assessed progress.  He stated that he and his girlfriend broke up a few weeks ago, that he is adjusting well emotionally and at this point feels he has moved beyond the relationship.  It was short-term, they spoke for just over a month or so.  Explored other peer relationships where he stated that he is talking to other friends on line, also his ex which remains a friend as well.  He stated that he recently was ejected from an online group due to his not talking often in the group.  He stated that he was only join the group periodically and also feels comfortable with the change.  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  He stated he has some financial stress, not much money and plans to apply.  Other ways to cope and care for himself were explored where he identified the need to work on his mood to feel less depressed by exercising, how he started about a week ago but hurt his ankle.  He plans to start again more slowly as he stated that he "overdid it".  He also denied 5 the need to manage his diet more effectively and along with the exercise, wants to lose weight.  Intervention: supportive therapy, motivational interviewing   Diagnoses:    ICD-10-CM   1. Moderate recurrent major depression ([place])  F33.1            Plan: Patient is to use CBT, mindfulness and coping skills to help manage stress,  and identify outlets for stress management, relaxation.  Patient to continue to utilize his support system and begin exercise.   Long-term goal:   Reduce overall level, frequency, and intensity of the feelings of emotional distress that inadvertently affects patient's mood, functioning and relationships for at least 3 consecutive months oer patient report.    Short-term goal:  Decrease anxiety producing self talk such as thinking of the worse possible life outcome Increase assertiveness with his communication to family members to elicit more help with day-to-day chores and tasks as needed   Assessment of progress:  progressing   This record has been created using [employer].  Chart creation errors have been sought, but may not always have been located and corrected. Such creation errors do not reflect on the standard of medical care.    Anson [place], [facility]

## 2022-09-05 ENCOUNTER — Other Ambulatory Visit (HOSPITAL_COMMUNITY): Payer: Self-pay

## 2022-09-05 DIAGNOSIS — R21 Rash and other nonspecific skin eruption: Secondary | ICD-10-CM | POA: Diagnosis not present

## 2022-09-05 DIAGNOSIS — J310 Chronic rhinitis: Secondary | ICD-10-CM | POA: Diagnosis not present

## 2022-09-05 DIAGNOSIS — Z23 Encounter for immunization: Secondary | ICD-10-CM | POA: Diagnosis not present

## 2022-09-05 DIAGNOSIS — J4530 Mild persistent asthma, uncomplicated: Secondary | ICD-10-CM | POA: Diagnosis not present

## 2022-09-05 MED ORDER — ALBUTEROL SULFATE HFA 108 (90 BASE) MCG/ACT IN AERS
2.0000 | INHALATION_SPRAY | RESPIRATORY_TRACT | 0 refills | Status: AC | PRN
  Filled 2022-09-05: qty 6.7, 17d supply, fill #0

## 2022-09-05 MED ORDER — QVAR REDIHALER 80 MCG/ACT IN AERB
2.0000 | INHALATION_SPRAY | Freq: Two times a day (BID) | RESPIRATORY_TRACT | 5 refills | Status: AC
  Filled 2022-09-05: qty 10.6, 30d supply, fill #0
  Filled 2022-11-09: qty 10.6, fill #0

## 2022-09-24 ENCOUNTER — Ambulatory Visit (INDEPENDENT_AMBULATORY_CARE_PROVIDER_SITE_OTHER): Payer: 59 | Admitting: Mental Health

## 2022-09-24 DIAGNOSIS — F331 Major depressive disorder, recurrent, moderate: Secondary | ICD-10-CM | POA: Diagnosis not present

## 2022-09-24 NOTE — Progress Notes (Signed)
[facility] psychotherapy Note  Name: David Sweeney Date: 09/24/22 MRN: [id number] DOB: 09/03/2001 PCP: Carlean Jews, NP  Time spent: 47 minutes  Treatment:  Individual therapy  Virtual Visit via Telehealth Note Connected with patient by a telemedicine/telehealth application, with their informed consent, and verified patient privacy and that I am speaking with the correct person using two identifiers. I discussed the limitations, risks, security and privacy concerns of performing psychotherapy and the availability of in person appointments. I also discussed with the patient that there may be a patient responsible charge related to this service. The patient expressed understanding and agreed to proceed. I discussed the treatment planning with the patient. The patient was provided an opportunity to ask questions and all were answered. The patient agreed with the plan and demonstrated an understanding of the instructions. The patient was advised to call  our office if  symptoms worsen or feel they are in a crisis state and need immediate contact.   Therapist Location: office Patient Location: home     Mental Status Exam:    Appearance:    Casual     Behavior:   Appropriate  Motor:   WNL  Speech/Language:    Clear and Coherent  Affect:   Full range   Mood:   Pleasant, anxious  Thought process:   normal  Thought content:     WNL  Sensory/Perceptual disturbances:     none  Orientation:   x4  Attention:   Good  Concentration:   Good  Memory:   Intact  Fund of knowledge:    Consistent with age and development  Insight:     Good  Judgment:    Good  Impulse Control:   Good     Reported Symptoms:  anxiety, neg self talk, stress-eating, "overly self conscious".  Risk Assessment: Danger to Self:  No Self-injurious Behavior: No Danger to Others: No Duty to Warn:no Physical Aggression / Violence:No  Access to Firearms a concern: No  Gang Involvement:No  Patient /  guardian was educated about steps to take if suicide or homicide risk level increases between visits: yes While future psychiatric events cannot be accurately predicted, the patient does not currently require acute inpatient psychiatric care and does not currently meet [facility] involuntary commitment criteria.     Medications: Current Outpatient Medications  Medication Sig Dispense Refill   albuterol (VENTOLIN HFA) 108 (90 Base) MCG/ACT inhaler Inhale 2 puffs into the lungs every 4-6 hours as needed for cough/wheeze 6.7 g 0   albuterol (VENTOLIN HFA) 108 (90 Base) MCG/ACT inhaler Inhale 2 puffs into the lungs every 4-6 hours as needed for cough/wheeze 6.7 g 0   beclomethasone (QVAR REDIHALER) 80 MCG/ACT inhaler Inhale 2 puffs into the lungs 2 (two) times daily 10.6 g 5   brexpiprazole (REXULTI) 2 MG TABS tablet Take 1 tablet (2 mg total) by mouth every other day. 90 tablet 0   buPROPion (WELLBUTRIN XL) 150 MG 24 hr tablet Take 1 tablet (150 mg total) by mouth daily. 90 tablet 0   cetirizine (ZYRTEC) 10 MG tablet Take 10 mg by mouth daily.     fluticasone (FLONASE) 50 MCG/ACT nasal spray Place 1 spray into both nostrils daily. 16 g 5   fluticasone (FLOVENT HFA) 110 MCG/ACT inhaler Inhale 2 puffs into the lungs 2 (two) times daily. 12 g 1   FLUZONE QUADRIVALENT 0.5 ML injection      rizatriptan (MAXALT) 10 MG tablet Take 1 tablet (10 mg total)  by mouth as needed for migraine. May repeat in 2 hours if needed 10 tablet 1   sertraline (ZOLOFT) 100 MG tablet Take 1 tablet (100 mg total) by mouth daily. 90 tablet 0   No current facility-administered medications for this visit.   Subjective:  Patient engaged in telehealth session via video.  Assessed progress since last visit.  He shared he has had some stress recently due to his parents having an argument which lasted about a week.  He went on to share how this increased his anxiety.  Explored more details with patient where he was able to  identify how they are doing somewhat better with their relationship and therefore he is less worried.  Reports ongoing depression over the past several weeks, stated that he is applied for a few jobs but has not heard back.  He plans to continue to apply and was open to the idea of following up in person with some of the employers if this would improve his odds of getting an interview.  He identified the need to also integrate some exercise as he has some weights at home which she hopes will help improve his mood.  Assess peer relationships, only talking to his one closer friend recently, less on line interactions.   Intervention: supportive therapy, motivational interviewing   Diagnoses:    ICD-10-CM   1. Moderate recurrent major depression (HCC)  F33.1        Plan: Patient is to use CBT, mindfulness and coping skills to help manage stress, and identify outlets for stress management, relaxation.  Patient to continue to utilize his support system and begin exercise.   Long-term goal:   Reduce overall level, frequency, and intensity of the feelings of emotional distress that inadvertently affects patient's mood, functioning and relationships for at least 3 consecutive months oer patient report.    Short-term goal:  Decrease anxiety producing self talk such as thinking of the worse possible life outcome Increase assertiveness with his communication to family members to elicit more help with day-to-day chores and tasks as needed   Assessment of progress:  progressing   This record has been created using [employer].  Chart creation errors have been sought, but may not always have been located and corrected. Such creation errors do not reflect on the standard of medical care.    Waldron Session, [facility]

## 2022-10-09 ENCOUNTER — Ambulatory Visit (INDEPENDENT_AMBULATORY_CARE_PROVIDER_SITE_OTHER): Payer: 59 | Admitting: Mental Health

## 2022-10-09 DIAGNOSIS — F331 Major depressive disorder, recurrent, moderate: Secondary | ICD-10-CM

## 2022-10-09 NOTE — Progress Notes (Signed)
[facility] psychotherapy Note  Name: David Sweeney Date: 10/09/22 MRN: [id number] DOB: 15-Oct-2001 PCP: Ronnell Freshwater, NP  Time spent: 47 minutes  Treatment:  Individual therapy   Mental Status Exam:    Appearance:    Casual     Behavior:   Appropriate  Motor:   WNL  Speech/Language:    Clear and Coherent  Affect:   Full range   Mood:   Pleasant, anxious  Thought process:   normal  Thought content:     WNL  Sensory/Perceptual disturbances:     none  Orientation:   x4  Attention:   Good  Concentration:   Good  Memory:   Intact  Fund of knowledge:    Consistent with age and development  Insight:     Good  Judgment:    Good  Impulse Control:   Good     Reported Symptoms:  anxiety, neg self talk, stress-eating, "overly self conscious".  Risk Assessment: Danger to Self:  No Self-injurious Behavior: No Danger to Others: No Duty to Warn:no Physical Aggression / Violence:No  Access to Firearms a concern: No  Gang Involvement:No  Patient / guardian was educated about steps to take if suicide or homicide risk level increases between visits: yes While future psychiatric events cannot be accurately predicted, the patient does not currently require acute inpatient psychiatric care and does not currently meet [facility] involuntary commitment criteria.     Medications: Current Outpatient Medications  Medication Sig Dispense Refill   albuterol (VENTOLIN HFA) 108 (90 Base) MCG/ACT inhaler Inhale 2 puffs into the lungs every 4-6 hours as needed for cough/wheeze 6.7 g 0   albuterol (VENTOLIN HFA) 108 (90 Base) MCG/ACT inhaler Inhale 2 puffs into the lungs every 4-6 hours as needed for cough/wheeze 6.7 g 0   beclomethasone (QVAR REDIHALER) 80 MCG/ACT inhaler Inhale 2 puffs into the lungs 2 (two) times daily 10.6 g 5   brexpiprazole (REXULTI) 2 MG TABS tablet Take 1 tablet (2 mg total) by mouth every other day. 90 tablet 0   buPROPion (WELLBUTRIN XL) 150 MG 24 hr  tablet Take 1 tablet (150 mg total) by mouth daily. 90 tablet 0   cetirizine (ZYRTEC) 10 MG tablet Take 10 mg by mouth daily.     fluticasone (FLONASE) 50 MCG/ACT nasal spray Place 1 spray into both nostrils daily. 16 g 5   fluticasone (FLOVENT HFA) 110 MCG/ACT inhaler Inhale 2 puffs into the lungs 2 (two) times daily. 12 g 1   FLUZONE QUADRIVALENT 0.5 ML injection      rizatriptan (MAXALT) 10 MG tablet Take 1 tablet (10 mg total) by mouth as needed for migraine. May repeat in 2 hours if needed 10 tablet 1   sertraline (ZOLOFT) 100 MG tablet Take 1 tablet (100 mg total) by mouth daily. 90 tablet 0   No current facility-administered medications for this visit.   Subjective:  Patient engaged in telehealth session.  He shared recent events, experiences.  He stated that he has continued to maintain aspects of the home, cooking often cleaning etc..  Continues to express motivation and some plans to try and find work either part-time or full time.  Expressed also the concern about household tasks such as cooking not being completed without his being there more often, his not wanting his mother to have to try and keep up as she works full-time.  Facilitated patient in reframing some follow-up thoughts associated, reviewing how his mother and father are both  supportive of his working in the past and how getting a job and having income as a way with which he feels he is making progress with some aspects of his life and needed to help with his depression, getting out of the house.  He stated that his aunt and uncle have been living with them for the past couple of weeks, he has been close to his aunt as she is supportive.  He plans to follow up by looking for employment and we explored his further discussing with his mother's concerns.  Intervention: supportive therapy, motivational interviewing   Diagnoses:    ICD-10-CM   1. Moderate recurrent major depression (HCC)  F33.1         Plan: Patient is to  use CBT, mindfulness and coping skills to help manage stress, and identify outlets for stress management, relaxation.  Patient to continue to utilize his support system and begin exercise.   Long-term goal:   Reduce overall level, frequency, and intensity of the feelings of emotional distress that inadvertently affects patient's mood, functioning and relationships for at least 3 consecutive months oer patient report.    Short-term goal:  Decrease anxiety producing self talk such as thinking of the worse possible life outcome Increase assertiveness with his communication to family members to elicit more help with day-to-day chores and tasks as needed   Assessment of progress:  progressing   This record has been created using [employer].  Chart creation errors have been sought, but may not always have been located and corrected. Such creation errors do not reflect on the standard of medical care.    Waldron Session, [facility]

## 2022-11-09 ENCOUNTER — Other Ambulatory Visit: Payer: Self-pay | Admitting: Psychiatry

## 2022-11-09 ENCOUNTER — Other Ambulatory Visit (HOSPITAL_COMMUNITY): Payer: Self-pay

## 2022-11-09 DIAGNOSIS — F331 Major depressive disorder, recurrent, moderate: Secondary | ICD-10-CM

## 2022-11-09 DIAGNOSIS — F411 Generalized anxiety disorder: Secondary | ICD-10-CM

## 2022-11-09 DIAGNOSIS — F22 Delusional disorders: Secondary | ICD-10-CM

## 2022-11-09 DIAGNOSIS — F422 Mixed obsessional thoughts and acts: Secondary | ICD-10-CM

## 2022-11-09 MED ORDER — FLUTICASONE PROPIONATE 50 MCG/ACT NA SUSP
1.0000 | Freq: Every day | NASAL | 5 refills | Status: AC
  Filled 2022-11-09: qty 16, 60d supply, fill #0
  Filled 2023-06-27: qty 16, 60d supply, fill #1
  Filled 2023-10-14 – 2023-10-17 (×2): qty 16, 60d supply, fill #2

## 2022-11-09 MED ORDER — ALBUTEROL SULFATE HFA 108 (90 BASE) MCG/ACT IN AERS
2.0000 | INHALATION_SPRAY | RESPIRATORY_TRACT | 0 refills | Status: AC | PRN
  Filled 2022-11-09: qty 6.7, 17d supply, fill #0

## 2022-11-09 NOTE — Telephone Encounter (Signed)
Please schedule appt

## 2022-11-12 NOTE — Telephone Encounter (Signed)
Please call to schedule an appt  

## 2022-11-15 NOTE — Telephone Encounter (Signed)
Pt has an appt tomorrow with Janett Billow

## 2022-11-16 ENCOUNTER — Telehealth (INDEPENDENT_AMBULATORY_CARE_PROVIDER_SITE_OTHER): Payer: Commercial Managed Care - PPO | Admitting: Psychiatry

## 2022-11-16 ENCOUNTER — Other Ambulatory Visit (HOSPITAL_COMMUNITY): Payer: Self-pay

## 2022-11-16 ENCOUNTER — Encounter: Payer: Self-pay | Admitting: Psychiatry

## 2022-11-16 DIAGNOSIS — F422 Mixed obsessional thoughts and acts: Secondary | ICD-10-CM | POA: Diagnosis not present

## 2022-11-16 DIAGNOSIS — F330 Major depressive disorder, recurrent, mild: Secondary | ICD-10-CM

## 2022-11-16 DIAGNOSIS — F411 Generalized anxiety disorder: Secondary | ICD-10-CM

## 2022-11-16 MED ORDER — BREXPIPRAZOLE 2 MG PO TABS
2.0000 mg | ORAL_TABLET | ORAL | 1 refills | Status: DC
  Filled 2022-11-16: qty 45, 90d supply, fill #0

## 2022-11-16 MED ORDER — SERTRALINE HCL 100 MG PO TABS
100.0000 mg | ORAL_TABLET | Freq: Every day | ORAL | 1 refills | Status: DC
  Filled 2022-11-16: qty 90, 90d supply, fill #0

## 2022-11-16 MED ORDER — BUPROPION HCL ER (XL) 150 MG PO TB24
150.0000 mg | ORAL_TABLET | Freq: Every day | ORAL | 1 refills | Status: DC
  Filled 2022-11-16: qty 90, 90d supply, fill #0

## 2022-11-16 NOTE — Progress Notes (Signed)
David Sweeney [id number] October 29, 2001 22 y.o.  Virtual Visit via Video Note  I connected with pt @ on 11/16/22 at 12:45 PM EST by a video enabled telemedicine application and verified that I am speaking with the correct person using two identifiers.   I discussed the limitations of evaluation and management by telemedicine and the availability of in person appointments. The patient expressed understanding and agreed to proceed.  I discussed the assessment and treatment plan with the patient. The patient was provided an opportunity to ask questions and all were answered. The patient agreed with the plan and demonstrated an understanding of the instructions.   The patient was advised to call back or seek an in-person evaluation if the symptoms worsen or if the condition fails to improve as anticipated.  I provided 25 minutes of non-face-to-face time during this encounter.  The patient was located at home.  The provider was located at [facility].   Corie Chiquito, PMHNP   Subjective:   Patient ID:  David Sweeney is a 22 y.o. (DOB [birth year]) male.  Chief Complaint:  Chief Complaint  Patient presents with   Follow-up    Depression, anxiety    HPI David Sweeney presents for follow-up of anxiety and depression. He denies any significant changes since last visit since 04/19/22. Reports depression, "comes and goes." He reports some seasonal depression. Denies current depression. He reports that his anxiety has been "manageable." He reports that sleep has been "alright" and recently has been waking up every couple of hours for the last month. He reports an adequate amount of sleep. He reports, "lately I have been very motivated." Energy has been good. Taking care of animals and keeping the home clean. He reports that his appetite continues to fluctuate. He reports difficulty sustaining focus if there are multiple stimuli. Denies anhedonia. Denies any recent SI.   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   He has been drinking "heavily" and is trying to cut back. He reports that he usually drinks a beer and reports drinking a bottle of alcohol yesterday. Denies ETOH withdrawal s/s.   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   Transportation issues are improving some. Aunt, her ex, and 3 dogs have been staying with them. There are now a total of 15 animals in the house.   Past Psychiatric Medication Trials: Clomipramine- helpful for OCD. Caused worsening depression. Lexapro Luvox Zoloft Effexor XR Rexulti Saphris Klonopin    Review of Systems:  Review of Systems  Respiratory:  Negative for shortness of breath.   Gastrointestinal:  Positive for diarrhea.  Musculoskeletal:  Negative for gait problem.       Reports recent pulled muscle.  Neurological:  Negative for tremors.  Psychiatric/Behavioral:         Please refer to HPI    Medications: I have reviewed the patient's current medications.  Current Outpatient Medications  Medication Sig Dispense Refill   albuterol (VENTOLIN HFA) 108 (90 Base) MCG/ACT inhaler Inhale 2 puffs into the lungs every 4-6 hours as needed for cough/wheeze 6.7 g 0   cetirizine (ZYRTEC) 10 MG tablet Take 10 mg by mouth daily.     fluticasone (FLONASE) 50 MCG/ACT nasal spray Place 1 spray into both nostrils daily. 16 g 5   fluticasone (FLOVENT HFA) 110 MCG/ACT inhaler Inhale 2 puffs into the lungs 2 (two) times daily. 12 g 1   rizatriptan (MAXALT) 10 MG tablet Take  1 tablet (10 mg total) by mouth as needed for migraine. May repeat in 2 hours if needed 10 tablet 1   albuterol (VENTOLIN HFA) 108 (90 Base) MCG/ACT inhaler Inhale 2 puffs into the lungs every 4-6 hours as needed for cough/wheeze 6.7 g 0   beclomethasone (QVAR REDIHALER) 80 MCG/ACT inhaler Inhale 2 puffs into the lungs 2 (two) times daily (Patient not taking: Reported on 11/16/2022) 10.6 g 5   brexpiprazole (REXULTI) 2 MG TABS  tablet Take 1 tablet (2 mg total) by mouth every other day. 45 tablet 1   buPROPion (WELLBUTRIN XL) 150 MG 24 hr tablet Take 1 tablet (150 mg total) by mouth daily. 90 tablet 1   FLUZONE QUADRIVALENT 0.5 ML injection      sertraline (ZOLOFT) 100 MG tablet Take 1 tablet (100 mg total) by mouth daily. 90 tablet 1   No current facility-administered medications for this visit.    Medication Side Effects: Other: Possible sexual side effects  Allergies: No Known Allergies  Past Medical History:  Diagnosis Date   Asthma     Family History  Problem Relation Age of Onset   ADD / ADHD Father    Hypertension Father    ADD / ADHD Sister    Depression Sister    ADD / ADHD Brother    ADD / ADHD Maternal Aunt    Depression Maternal Aunt    Alcohol abuse Paternal Aunt    Depression Paternal Aunt    ADD / ADHD Maternal Grandmother    Cancer Maternal Grandmother        colon, appendix   Depression Maternal Grandmother    Hypertension Maternal Grandmother    Diabetes Paternal Grandfather     Social History   Socioeconomic History   Marital status: Single    Spouse name: Not on file   Number of children: Not on file   Years of education: Not on file   Highest education level: Not on file  Occupational History   Not on file  Tobacco Use   Smoking status: Never   Smokeless tobacco: Never  Vaping Use   Vaping Use: Never used  Substance and Sexual Activity   Alcohol use: Yes    Alcohol/week: 2.0 standard drinks of alcohol    Types: 2 Cans of beer per week   Drug use: Not Currently    Types: Marijuana   Sexual activity: Not Currently  Other Topics Concern   Not on file  Social History Narrative   David Sweeney is in the 12th grade at [employer]; he does well in school. He lives with parents and siblings. He enjoys video games, listening to music, and hanging out with friends.    Social Determinants of Health   Financial Resource Strain: Not on file  Food Insecurity: Not  on file  Transportation Needs: Not on file  Physical Activity: Not on file  Stress: Not on file  Social Connections: Not on file  Intimate Partner Violence: Not on file    Past Medical History, Surgical history, Social history, and Family history were reviewed and updated as appropriate.   Please see review of systems for further details on the patient's review from today.   Objective:   Physical Exam:  There were no vitals taken for this visit.  Physical Exam Constitutional:      General: He is not in acute distress. Musculoskeletal:        General: No deformity.  Neurological:     Mental  Status: He is alert and oriented to person, place, and time.     Coordination: Coordination normal.  Psychiatric:        Attention and Perception: Attention and perception normal. He does not perceive auditory or visual hallucinations.        Mood and Affect: Mood normal. Mood is not anxious or depressed. Affect is not labile, blunt, angry or inappropriate.        Speech: Speech normal.        Behavior: Behavior normal.        Thought Content: Thought content normal. Thought content is not paranoid or delusional. Thought content does not include homicidal or suicidal ideation. Thought content does not include homicidal or suicidal plan.        Cognition and Memory: Cognition and memory normal.        Judgment: Judgment normal.     Comments: Insight intact     Lab Review:     Component Value Date/Time   NA 142 02/08/2022 0925   K 4.5 02/08/2022 0925   CL 104 02/08/2022 0925   CO2 24 02/08/2022 0925   GLUCOSE 97 02/08/2022 0925   BUN 9 02/08/2022 0925   CREATININE 0.97 02/08/2022 0925   CALCIUM 9.6 02/08/2022 0925   PROT 7.4 02/08/2022 0925   ALBUMIN 4.4 02/08/2022 0925   AST 16 02/08/2022 0925   ALT 24 02/08/2022 0925   ALKPHOS 69 02/08/2022 0925   BILITOT 0.5 02/08/2022 0925       Component Value Date/Time   WBC 6.0 02/08/2022 0925   RBC 5.67 02/08/2022 0925   HGB 16.9  02/08/2022 0925   HCT 49.4 02/08/2022 0925   PLT 188 02/08/2022 0925   MCV 87 02/08/2022 0925   MCH 29.8 02/08/2022 0925   MCHC 34.2 02/08/2022 0925   RDW 12.8 02/08/2022 0925   LYMPHSABS 2.0 02/08/2022 0925   EOSABS 0.1 02/08/2022 0925   BASOSABS 0.0 02/08/2022 0925    No results found for: "POCLITH", "LITHIUM"   No results found for: "PHENYTOIN", "PHENOBARB", "VALPROATE", "CBMZ"   .res Assessment: Plan:   Pt seen for 25 minutes and time spent discussing ETOH use and that this can interfere with sleep maintenance. Pt reports that he has been trying to reduce ETOH use. He declines referral for substance abuse treatment. Recommended discussing ETOH use with current therapist.  Will continue Rexulti 2 mg po qd for anxiety and depression.  Continue Sertraline 100 mg po qd for anxiety and depression. Continue Wellbutrin XL 150 mg po qd for depression.  Recommend continuing psychotherapy with Lanetta Inch, [facility].  Pt to follow-up in 6 months or sooner if clinically indicated.  Patient advised to contact office with any questions, adverse effects, or acute worsening in signs and symptoms.   Toan was seen today for follow-up.  Diagnoses and all orders for this visit:  Mixed obsessional thoughts and acts -     brexpiprazole (REXULTI) 2 MG TABS tablet; Take 1 tablet (2 mg total) by mouth every other day. -     sertraline (ZOLOFT) 100 MG tablet; Take 1 tablet (100 mg total) by mouth daily.  Mild episode of recurrent major depressive disorder (HCC) -     buPROPion (WELLBUTRIN XL) 150 MG 24 hr tablet; Take 1 tablet (150 mg total) by mouth daily. -     sertraline (ZOLOFT) 100 MG tablet; Take 1 tablet (100 mg total) by mouth daily.  GAD (generalized anxiety disorder) -     sertraline (ZOLOFT)  100 MG tablet; Take 1 tablet (100 mg total) by mouth daily.     Please see After Visit Summary for patient specific instructions.  Future Appointments  Date Time Provider Department Center   11/20/2022  3:00 PM Waldron Session, [facility] None  02/04/2023  8:30 AM [facility] None  02/14/2023  2:10 PM Boscia, Heather E, NP [facility] None    No orders of the defined types were placed in this encounter.     -------------------------------

## 2022-11-20 ENCOUNTER — Ambulatory Visit: Payer: 59 | Admitting: Mental Health

## 2022-12-14 ENCOUNTER — Telehealth (INDEPENDENT_AMBULATORY_CARE_PROVIDER_SITE_OTHER): Payer: Commercial Managed Care - PPO | Admitting: Mental Health

## 2022-12-14 DIAGNOSIS — F422 Mixed obsessional thoughts and acts: Secondary | ICD-10-CM

## 2022-12-14 NOTE — Progress Notes (Addendum)
[facility] psychotherapy Note  Name: David Sweeney Date: 12/14/22 MRN: [id number] DOB: 20-Nov-2000 PCP: Ronnell Freshwater, NP  Time spent: 49 minutes  Treatment:  Individual therapy  Virtual Visit via Telehealth Note Connected with patient by a telemedicine/telehealth application, with their informed consent, and verified patient privacy and that I am speaking with the correct person using two identifiers. I discussed the limitations, risks, security and privacy concerns of performing psychotherapy and the availability of in person appointments. I also discussed with the patient that there may be a patient responsible charge related to this service. The patient expressed understanding and agreed to proceed. I discussed the treatment planning with the patient. The patient was provided an opportunity to ask questions and all were answered. The patient agreed with the plan and demonstrated an understanding of the instructions. The patient was advised to call  our office if  symptoms worsen or feel they are in a crisis state and need immediate contact.   Therapist Location: office Patient Location: home     Mental Status Exam:    Appearance:    Casual     Behavior:   Appropriate  Motor:   WNL  Speech/Language:    Clear and Coherent  Affect:   Full range   Mood:   Pleasant, anxious  Thought process:   normal  Thought content:     WNL  Sensory/Perceptual disturbances:     none  Orientation:   x4  Attention:   Good  Concentration:   Good  Memory:   Intact  Fund of knowledge:    Consistent with age and development  Insight:     Good  Judgment:    Good  Impulse Control:   Good     Reported Symptoms:  anxiety, neg self talk, stress-eating, "overly self conscious".  Risk Assessment: Danger to Self:  No Self-injurious Behavior: No Danger to Others: No Duty to Warn:no Physical Aggression / Violence:No  Access to Firearms a concern: No  Gang Involvement:No  Patient /  guardian was educated about steps to take if suicide or homicide risk level increases between visits: yes While future psychiatric events cannot be accurately predicted, the patient does not currently require acute inpatient psychiatric care and does not currently meet [facility] involuntary commitment criteria.     Medications: Current Outpatient Medications  Medication Sig Dispense Refill   albuterol (VENTOLIN HFA) 108 (90 Base) MCG/ACT inhaler Inhale 2 puffs into the lungs every 4-6 hours as needed for cough/wheeze 6.7 g 0   albuterol (VENTOLIN HFA) 108 (90 Base) MCG/ACT inhaler Inhale 2 puffs into the lungs every 4-6 hours as needed for cough/wheeze 6.7 g 0   beclomethasone (QVAR REDIHALER) 80 MCG/ACT inhaler Inhale 2 puffs into the lungs 2 (two) times daily (Patient not taking: Reported on 11/16/2022) 10.6 g 5   brexpiprazole (REXULTI) 2 MG TABS tablet Take 1 tablet (2 mg total) by mouth every other day. 45 tablet 1   buPROPion (WELLBUTRIN XL) 150 MG 24 hr tablet Take 1 tablet (150 mg total) by mouth daily. 90 tablet 1   cetirizine (ZYRTEC) 10 MG tablet Take 10 mg by mouth daily.     fluticasone (FLONASE) 50 MCG/ACT nasal spray Place 1 spray into both nostrils daily. 16 g 5   fluticasone (FLOVENT HFA) 110 MCG/ACT inhaler Inhale 2 puffs into the lungs 2 (two) times daily. 12 g 1   FLUZONE QUADRIVALENT 0.5 ML injection      rizatriptan (MAXALT) 10 MG tablet  Take 1 tablet (10 mg total) by mouth as needed for migraine. May repeat in 2 hours if needed 10 tablet 1   sertraline (ZOLOFT) 100 MG tablet Take 1 tablet (100 mg total) by mouth daily. 90 tablet 1   No current facility-administered medications for this visit.   Subjective:  Patient engaged in telehealth session via video.  Assessed progress since last visit which was about 2 months ago.  He stated that he is experiencing "ups and downs".  He stated that on a positive note, he is now in a relationship with someone who he met online.   Explored some details of the relationship where he stated some challenges are that they cope with some mental health issues and some codependence.  He stated that they may have some difficult discussions due to their thinking that he is being unfaithful, which patient stated is of course unsure but how he has to reassure them every few days.  He stated this comes out of their past relationships challenges and they are working to be less reactive.  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  He stated he has some feelings of guilt due to one of his pets passing away where he feels somewhat responsible.  After further details were shared, facilitated his identifying self supportive thoughts, particularly due to his not being responsible for the passing of his pet while also acknowledging the loss and feelings associated. He continues to struggle to find work although he expresses a desire to find part-time work.  He stated that currently, he does not feel he has time to work as he is the only 1 in the house taking care of the pets which are up to 66.  Patient also tends to majority of cleaning and cooking.  He is considering going back to school and plans to follow through with identifying    Intervention: supportive therapy, motivational interviewing   Diagnoses:    ICD-10-CM   1. Mixed obsessional thoughts and acts  F42.2          Plan: Patient is to use CBT, mindfulness and coping skills to help manage stress, and identify outlets for stress management, relaxation.  Patient to continue to utilize his support system and begin exercise.   Long-term goal:   Reduce overall level, frequency, and intensity of the feelings of emotional distress that inadvertently affects patient's mood, functioning and relationships for at least 3 consecutive months oer patient report.    Short-term goal:  Decrease anxiety producing self talk such as  thinking of the worse possible life outcome Increase assertiveness with his communication to family members to elicit more help with day-to-day chores and tasks as needed   Assessment of progress:  progressing   This record has been created using [employer].  Chart creation errors have been sought, but may not always have been located and corrected. Such creation errors do not reflect on the standard of medical care.    [place], [facility]

## 2023-01-02 DIAGNOSIS — Z0100 Encounter for examination of eyes and vision without abnormal findings: Secondary | ICD-10-CM | POA: Diagnosis not present

## 2023-01-10 ENCOUNTER — Telehealth (INDEPENDENT_AMBULATORY_CARE_PROVIDER_SITE_OTHER): Payer: Commercial Managed Care - PPO | Admitting: Mental Health

## 2023-01-10 DIAGNOSIS — F422 Mixed obsessional thoughts and acts: Secondary | ICD-10-CM

## 2023-01-10 NOTE — Progress Notes (Signed)
[facility] psychotherapy Note  Name: David Sweeney Date: 01/10/23 MRN: [id number] DOB: Oct 09, 2001 PCP: Ronnell Freshwater, NP  Time spent: 46 minutes  Treatment:  Individual therapy  Virtual Visit via Telehealth Note Connected with patient by a telemedicine/telehealth application, with their informed consent, and verified patient privacy and that I am speaking with the correct person using two identifiers. I discussed the limitations, risks, security and privacy concerns of performing psychotherapy and the availability of in person appointments. I also discussed with the patient that there may be a patient responsible charge related to this service. The patient expressed understanding and agreed to proceed. I discussed the treatment planning with the patient. The patient was provided an opportunity to ask questions and all were answered. The patient agreed with the plan and demonstrated an understanding of the instructions. The patient was advised to call  our office if  symptoms worsen or feel they are in a crisis state and need immediate contact.   Therapist Location: office Patient Location: home     Mental Status Exam:    Appearance:    Casual     Behavior:   Appropriate  Motor:   WNL  Speech/Language:    Clear and Coherent  Affect:   Full range   Mood:   Pleasant, anxious  Thought process:   normal  Thought content:     WNL  Sensory/Perceptual disturbances:     none  Orientation:   x4  Attention:   Good  Concentration:   Good  Memory:   Intact  Fund of knowledge:    Consistent with age and development  Insight:     Good  Judgment:    Good  Impulse Control:   Good     Reported Symptoms:  anxiety, neg self talk, stress-eating, "overly self conscious".  Risk Assessment: Danger to Self:  No Self-injurious Behavior: No Danger to Others: No Duty to Warn:no Physical Aggression / Violence:No  Access to Firearms a concern: No  Gang Involvement:No  Patient /  guardian was educated about steps to take if suicide or homicide risk level increases between visits: yes While future psychiatric events cannot be accurately predicted, the patient does not currently require acute inpatient psychiatric care and does not currently meet [facility] involuntary commitment criteria.     Medications: Current Outpatient Medications  Medication Sig Dispense Refill   albuterol (VENTOLIN HFA) 108 (90 Base) MCG/ACT inhaler Inhale 2 puffs into the lungs every 4-6 hours as needed for cough/wheeze 6.7 g 0   albuterol (VENTOLIN HFA) 108 (90 Base) MCG/ACT inhaler Inhale 2 puffs into the lungs every 4-6 hours as needed for cough/wheeze 6.7 g 0   beclomethasone (QVAR REDIHALER) 80 MCG/ACT inhaler Inhale 2 puffs into the lungs 2 (two) times daily (Patient not taking: Reported on 11/16/2022) 10.6 g 5   brexpiprazole (REXULTI) 2 MG TABS tablet Take 1 tablet (2 mg total) by mouth every other day. 45 tablet 1   buPROPion (WELLBUTRIN XL) 150 MG 24 hr tablet Take 1 tablet (150 mg total) by mouth daily. 90 tablet 1   cetirizine (ZYRTEC) 10 MG tablet Take 10 mg by mouth daily.     fluticasone (FLONASE) 50 MCG/ACT nasal spray Place 1 spray into both nostrils daily. 16 g 5   fluticasone (FLOVENT HFA) 110 MCG/ACT inhaler Inhale 2 puffs into the lungs 2 (two) times daily. 12 g 1   FLUZONE QUADRIVALENT 0.5 ML injection      rizatriptan (MAXALT) 10 MG tablet  Take 1 tablet (10 mg total) by mouth as needed for migraine. May repeat in 2 hours if needed 10 tablet 1   sertraline (ZOLOFT) 100 MG tablet Take 1 tablet (100 mg total) by mouth daily. 90 tablet 1   No current facility-administered medications for this visit.   Subjective:  Patient engaged in telehealth session via video.  Assessed progress. He continues to be in relationship with a girl he met online.  One of family pets passed away. He continues to clean, keep up with household responsibility. Describes mood as "apathetic, I'm  just here existing doing stuff".  When assessing mood he denied feeling any moments of happiness however, when exploring relationship and communication with his girlfriend he acknowledged some happiness in those discussions with her.  Describes himself as feeling "stuck" recently, being at home most of the days feeling the same.  Explored areas he feels he continues to need to work on toward making change with his mood. Discussed potential benefits of some exercise, explored outlets for enjoyment where he plans to follow through with engaging in crocheting which is a new hobby.  He continues to enjoy playing video games as well.  He continues to consider going back to school but is unsure of what area of study; he stated needs to raise his GPA.  He plans to follow through with looking into resources on 9 discuss the get help and possibly narrow down an area of study or career.   Intervention: supportive therapy, motivational interviewing   Diagnoses:    ICD-10-CM   1. Mixed obsessional thoughts and acts  F42.2         Plan: Patient is to use CBT, mindfulness and coping skills to help manage stress, and identify outlets for stress management, relaxation.  Patient to continue to utilize his support system and begin exercise.   Long-term goal:   Reduce overall level, frequency, and intensity of the feelings of emotional distress that inadvertently affects patient's mood, functioning and relationships for at least 3 consecutive months oer patient report.    Short-term goal:  Decrease anxiety producing self talk such as thinking of the worse possible life outcome Increase assertiveness with his communication to family members to elicit more help with day-to-day chores and tasks as needed   Assessment of progress:  progressing   This record has been created using [employer].  Chart creation errors have been sought, but may not always have been located and corrected. Such creation errors do  not reflect on the standard of medical care.    Anson [place], [facility]

## 2023-01-31 ENCOUNTER — Other Ambulatory Visit: Payer: Self-pay

## 2023-01-31 DIAGNOSIS — Z130 Encounter for screening for diseases of the blood and blood-forming organs and certain disorders involving the immune mechanism: Secondary | ICD-10-CM

## 2023-01-31 DIAGNOSIS — Z0000 Encounter for general adult medical examination without abnormal findings: Secondary | ICD-10-CM

## 2023-02-04 ENCOUNTER — Other Ambulatory Visit: Payer: Commercial Managed Care - PPO

## 2023-02-04 DIAGNOSIS — Z1321 Encounter for screening for nutritional disorder: Secondary | ICD-10-CM | POA: Diagnosis not present

## 2023-02-04 DIAGNOSIS — Z13228 Encounter for screening for other metabolic disorders: Secondary | ICD-10-CM | POA: Diagnosis not present

## 2023-02-04 DIAGNOSIS — Z0000 Encounter for general adult medical examination without abnormal findings: Secondary | ICD-10-CM | POA: Diagnosis not present

## 2023-02-04 DIAGNOSIS — Z130 Encounter for screening for diseases of the blood and blood-forming organs and certain disorders involving the immune mechanism: Secondary | ICD-10-CM

## 2023-02-04 DIAGNOSIS — Z1329 Encounter for screening for other suspected endocrine disorder: Secondary | ICD-10-CM | POA: Diagnosis not present

## 2023-02-05 LAB — COMPREHENSIVE METABOLIC PANEL
ALT: 22 IU/L (ref 0–44)
AST: 16 IU/L (ref 0–40)
Albumin/Globulin Ratio: 1.8 (ref 1.2–2.2)
Albumin: 4.8 g/dL (ref 4.3–5.2)
Alkaline Phosphatase: 70 IU/L (ref 44–121)
BUN/Creatinine Ratio: 10 (ref 9–20)
BUN: 10 mg/dL (ref 6–20)
Bilirubin Total: 0.7 mg/dL (ref 0.0–1.2)
CO2: 24 mmol/L (ref 20–29)
Calcium: 9.6 mg/dL (ref 8.7–10.2)
Chloride: 101 mmol/L (ref 96–106)
Creatinine, Ser: 0.96 mg/dL (ref 0.76–1.27)
Globulin, Total: 2.7 g/dL (ref 1.5–4.5)
Glucose: 98 mg/dL (ref 70–99)
Potassium: 4 mmol/L (ref 3.5–5.2)
Sodium: 139 mmol/L (ref 134–144)
Total Protein: 7.5 g/dL (ref 6.0–8.5)
eGFR: 114 mL/min/{1.73_m2} (ref >59)

## 2023-02-05 LAB — CBC
Hematocrit: 50.6 % (ref 37.5–51.0)
Hemoglobin: 16.8 g/dL (ref 13.0–17.7)
MCH: 28.9 pg (ref 26.6–33.0)
MCHC: 33.2 g/dL (ref 31.5–35.7)
MCV: 87 fL (ref 79–97)
Platelets: 236 10*3/uL (ref 150–450)
RBC: 5.81 x10E6/uL — ABNORMAL HIGH (ref 4.14–5.80)
RDW: 12.2 % (ref 11.6–15.4)
WBC: 6.5 10*3/uL (ref 3.4–10.8)

## 2023-02-05 LAB — LIPID PANEL
Chol/HDL Ratio: 6.1 ratio — ABNORMAL HIGH (ref 0.0–5.0)
Cholesterol, Total: 231 mg/dL — ABNORMAL HIGH (ref 100–199)
HDL: 38 mg/dL — ABNORMAL LOW (ref >39)
LDL Chol Calc (NIH): 140 mg/dL — ABNORMAL HIGH (ref 0–99)
Triglycerides: 291 mg/dL — ABNORMAL HIGH (ref 0–149)
VLDL Cholesterol Cal: 53 mg/dL — ABNORMAL HIGH (ref 5–40)

## 2023-02-05 LAB — TSH: TSH: 1.12 u[IU]/mL (ref 0.450–4.500)

## 2023-02-05 LAB — HEMOGLOBIN A1C
Est. average glucose Bld gHb Est-mCnc: 117 mg/dL
Hgb A1c MFr Bld: 5.7 % — ABNORMAL HIGH (ref 4.8–5.6)

## 2023-02-13 ENCOUNTER — Ambulatory Visit (INDEPENDENT_AMBULATORY_CARE_PROVIDER_SITE_OTHER): Payer: Commercial Managed Care - PPO | Admitting: Mental Health

## 2023-02-13 DIAGNOSIS — F422 Mixed obsessional thoughts and acts: Secondary | ICD-10-CM

## 2023-02-13 NOTE — Progress Notes (Signed)
[facility] psychotherapy Note  Name: David Sweeney Date: 02/13/23 MRN: [id number] DOB: Aug 31, 2001 PCP: Carlean Jews, NP  Time spent:  46 minutes  Treatment:  Individual therapy  Virtual Visit via Telehealth Note Connected with patient by a telemedicine/telehealth application, with their informed consent, and verified patient privacy and that I am speaking with the correct person using two identifiers. I discussed the limitations, risks, security and privacy concerns of performing psychotherapy and the availability of in person appointments. I also discussed with the patient that there may be a patient responsible charge related to this service. The patient expressed understanding and agreed to proceed. I discussed the treatment planning with the patient. The patient was provided an opportunity to ask questions and all were answered. The patient agreed with the plan and demonstrated an understanding of the instructions. The patient was advised to call  our office if  symptoms worsen or feel they are in a crisis state and need immediate contact.   Therapist Location: office Patient Location: home     Mental Status Exam:    Appearance:    Casual     Behavior:   Appropriate  Motor:   WNL  Speech/Language:    Clear and Coherent  Affect:   Full range   Mood:   Pleasant, anxious  Thought process:   normal  Thought content:     WNL  Sensory/Perceptual disturbances:     none  Orientation:   x4  Attention:   Good  Concentration:   Good  Memory:   Intact  Fund of knowledge:    Consistent with age and development  Insight:     Good  Judgment:    Good  Impulse Control:   Good     Reported Symptoms:  anxiety, neg self talk, stress-eating, "overly self conscious".  Risk Assessment: Danger to Self:  No Self-injurious Behavior: No Danger to Others: No Duty to Warn:no Physical Aggression / Violence:No  Access to Firearms a concern: No  Gang Involvement:No  Patient /  guardian was educated about steps to take if suicide or homicide risk level increases between visits: yes While future psychiatric events cannot be accurately predicted, the patient does not currently require acute inpatient psychiatric care and does not currently meet [facility] involuntary commitment criteria.     Medications: Current Outpatient Medications  Medication Sig Dispense Refill   albuterol (VENTOLIN HFA) 108 (90 Base) MCG/ACT inhaler Inhale 2 puffs into the lungs every 4-6 hours as needed for cough/wheeze 6.7 g 0   albuterol (VENTOLIN HFA) 108 (90 Base) MCG/ACT inhaler Inhale 2 puffs into the lungs every 4-6 hours as needed for cough/wheeze 6.7 g 0   beclomethasone (QVAR REDIHALER) 80 MCG/ACT inhaler Inhale 2 puffs into the lungs 2 (two) times daily (Patient not taking: Reported on 11/16/2022) 10.6 g 5   brexpiprazole (REXULTI) 2 MG TABS tablet Take 1 tablet (2 mg total) by mouth every other day. 45 tablet 1   buPROPion (WELLBUTRIN XL) 150 MG 24 hr tablet Take 1 tablet (150 mg total) by mouth daily. 90 tablet 1   cetirizine (ZYRTEC) 10 MG tablet Take 10 mg by mouth daily.     fluticasone (FLONASE) 50 MCG/ACT nasal spray Place 1 spray into both nostrils daily. 16 g 5   fluticasone (FLOVENT HFA) 110 MCG/ACT inhaler Inhale 2 puffs into the lungs 2 (two) times daily. 12 g 1   FLUZONE QUADRIVALENT 0.5 ML injection      rizatriptan (MAXALT) 10 MG  tablet Take 1 tablet (10 mg total) by mouth as needed for migraine. May repeat in 2 hours if needed 10 tablet 1   sertraline (ZOLOFT) 100 MG tablet Take 1 tablet (100 mg total) by mouth daily. 90 tablet 1   No current facility-administered medications for this visit.   Subjective:  Patient engaged in telehealth session via video.  Assessed progress. He stated he has been doing "no so good". He stated his parents may separate due to his cheating on his mother.  He stated this came out a lot of ago and it has been very stressful for the  household.  He stated that his mother was very depressed, she is now continuing her therapy and reports she has had an improved mood over the past week or 2.  Explored the impact this had on himself as well as other family members, his siblings excetra.  Provide support and understanding throughout, facilitated his identifying associated feelings of disappointment and regret as he defended his dad a few months back when his mother was suspicious of his behaviors.  He stated he has support from his girlfriend, their relationship continues now at 3 months.  He stated he has been isolating in his room and we explore ways for him to get out of his room more often, go for walks.  He stated that he is trying to apply for jobs but has not heard back yet engage in some problem solving with him to continue the consistency of applying and following up with potential employers.     Intervention: supportive therapy, motivational interviewing   Diagnoses:    ICD-10-CM   1. Mixed obsessional thoughts and acts  F42.2          Plan: Patient is to use CBT, mindfulness and coping skills to help manage stress, and identify outlets for stress management, relaxation.  Patient to continue to utilize his support system and begin exercise.   Long-term goal:   Reduce overall level, frequency, and intensity of the feelings of emotional distress that inadvertently affects patient's mood, functioning and relationships for at least 3 consecutive months oer patient report.    Short-term goal:  Decrease anxiety producing self talk such as thinking of the worse possible life outcome Increase assertiveness with his communication to family members to elicit more help with day-to-day chores and tasks as needed   Assessment of progress:  progressing   This record has been created using [employer].  Chart creation errors have been sought, but may not always have been located and corrected. Such creation errors do not  reflect on the standard of medical care.    Waldron Session, [facility]

## 2023-02-14 ENCOUNTER — Other Ambulatory Visit (HOSPITAL_COMMUNITY): Payer: Self-pay

## 2023-02-14 ENCOUNTER — Ambulatory Visit (INDEPENDENT_AMBULATORY_CARE_PROVIDER_SITE_OTHER): Payer: Commercial Managed Care - PPO | Admitting: Nurse Practitioner

## 2023-02-14 ENCOUNTER — Encounter: Payer: Self-pay | Admitting: Nurse Practitioner

## 2023-02-14 VITALS — BP 133/80 | HR 98 | Ht 70.8 in | Wt 247.1 lb

## 2023-02-14 DIAGNOSIS — G43909 Migraine, unspecified, not intractable, without status migrainosus: Secondary | ICD-10-CM

## 2023-02-14 DIAGNOSIS — K580 Irritable bowel syndrome with diarrhea: Secondary | ICD-10-CM | POA: Diagnosis not present

## 2023-02-14 DIAGNOSIS — F331 Major depressive disorder, recurrent, moderate: Secondary | ICD-10-CM | POA: Diagnosis not present

## 2023-02-14 DIAGNOSIS — Z0001 Encounter for general adult medical examination with abnormal findings: Secondary | ICD-10-CM | POA: Diagnosis not present

## 2023-02-14 DIAGNOSIS — E782 Mixed hyperlipidemia: Secondary | ICD-10-CM

## 2023-02-14 MED ORDER — DICYCLOMINE HCL 20 MG PO TABS
10.0000 mg | ORAL_TABLET | Freq: Three times a day (TID) | ORAL | 1 refills | Status: AC | PRN
Start: 2023-02-14 — End: ?
  Filled 2023-02-14: qty 90, 30d supply, fill #0

## 2023-02-14 NOTE — Progress Notes (Signed)
Complete physical exam   Patient: David Sweeney   DOB: 2001/05/07   22 y.o. Male  MRN: [id number] Visit Date: 02/14/2023    Chief Complaint  Patient presents with   Annual Exam   Subjective    David Sweeney is a 22 y.o. male who presents today for a complete physical exam.  He reports consuming a  generally healthy  diet. The patient does not participate in regular exercise at present. He generally feels fairly well. He does have additional problems to discuss today.   HPI  Annual physical  -chronic migraine headache -taking maxalt as needed  -sees psychiatry for anxiety, depression, ADHD Routine, fasting labs done prior to today's visit  --lipids moderately elevated.  --HgbA1c 5.7 with normal glucose  --other labs essentially normal  Having irritable bowel with diarrhea.  -has been worse recently - past few months.  -does have rectal pain and burning  -happening a few times out of everyday. Some days are worse than others.  -feels like coffee may make this worse, but really isn't sure  -having trouble with ability to ejaculate during intercourse.  -he is unsure of cause.  -tension/migraine headaches  -maxalt helps a little. Declines trial of different migraine reliever at this time.   Past Medical History:  Diagnosis Date   Asthma    Past Surgical History:  Procedure Laterality Date   TESTICLE TORSION REDUCTION     Social History   Socioeconomic History   Marital status: Single    Spouse name: Not on file   Number of children: Not on file   Years of education: Not on file   Highest education level: Not on file  Occupational History   Not on file  Tobacco Use   Smoking status: Never    Passive exposure: Current   Smokeless tobacco: Never  Vaping Use   Vaping Use: Never used  Substance and Sexual Activity   Alcohol use: Yes    Alcohol/week: 2.0 standard drinks of alcohol    Types: 2 Cans of beer per week    Comment: Occassionally.   Drug use: Not  Currently    Types: Marijuana   Sexual activity: Not Currently  Other Topics Concern   Not on file  Social History Narrative   Cail is in the 12th grade at [employer]; he does well in school. He lives with parents and siblings. He enjoys video games, listening to music, and hanging out with friends.    Social Determinants of Health   Financial Resource Strain: Not on file  Food Insecurity: Not on file  Transportation Needs: Not on file  Physical Activity: Not on file  Stress: Not on file  Social Connections: Not on file  Intimate Partner Violence: Not on file   Family Status  Relation Name Status   Mother  Alive   Father  Alive   Sister 36 yo Alive   Brother 29 yo Chemical engineer  (Not Specified)   Oceanographer  (Not Specified)   MGF  Deceased   MGM  Alive   PGF  Alive   PGM  Alive   Family History  Problem Relation Age of Onset   ADD / ADHD Father    Hypertension Father    ADD / ADHD Sister    Depression Sister    ADD / ADHD Brother    ADD / ADHD Maternal Aunt    Depression Maternal Aunt    Alcohol abuse Paternal  Aunt    Depression Paternal Aunt    ADD / ADHD Maternal Grandmother    Cancer Maternal Grandmother        colon, appendix   Depression Maternal Grandmother    Hypertension Maternal Grandmother    Diabetes Paternal Grandfather    No Known Allergies  Patient Care Team: Carlean Jews, NP as PCP - General (Family Medicine)   Medications: Outpatient Medications Prior to Visit  Medication Sig   albuterol (VENTOLIN HFA) 108 (90 Base) MCG/ACT inhaler Inhale 2 puffs into the lungs every 4-6 hours as needed for cough/wheeze   buPROPion (WELLBUTRIN XL) 150 MG 24 hr tablet Take 1 tablet (150 mg total) by mouth daily.   cetirizine (ZYRTEC) 10 MG tablet Take 10 mg by mouth daily.   fluticasone (FLONASE) 50 MCG/ACT nasal spray Place 1 spray into both nostrils daily.   fluticasone (FLOVENT HFA) 110 MCG/ACT inhaler Inhale 2 puffs into the lungs 2  (two) times daily.   FLUZONE QUADRIVALENT 0.5 ML injection    rizatriptan (MAXALT) 10 MG tablet Take 1 tablet (10 mg total) by mouth as needed for migraine. May repeat in 2 hours if needed   sertraline (ZOLOFT) 100 MG tablet Take 1 tablet (100 mg total) by mouth daily.   albuterol (VENTOLIN HFA) 108 (90 Base) MCG/ACT inhaler Inhale 2 puffs into the lungs every 4-6 hours as needed for cough/wheeze   beclomethasone (QVAR REDIHALER) 80 MCG/ACT inhaler Inhale 2 puffs into the lungs 2 (two) times daily   brexpiprazole (REXULTI) 2 MG TABS tablet Take 1 tablet (2 mg total) by mouth every other day. (Patient not taking: Reported on 02/14/2023)   No facility-administered medications prior to visit.    Review of Systems See HPI    Last CBC Lab Results  Component Value Date   WBC 6.5 02/04/2023   HGB 16.8 02/04/2023   HCT 50.6 02/04/2023   MCV 87 02/04/2023   MCH 28.9 02/04/2023   RDW 12.2 02/04/2023   PLT 236 02/04/2023   Last metabolic panel Lab Results  Component Value Date   GLUCOSE 98 02/04/2023   NA 139 02/04/2023   K 4.0 02/04/2023   CL 101 02/04/2023   CO2 24 02/04/2023   BUN 10 02/04/2023   CREATININE 0.96 02/04/2023   EGFR 114 02/04/2023   CALCIUM 9.6 02/04/2023   PROT 7.5 02/04/2023   ALBUMIN 4.8 02/04/2023   LABGLOB 2.7 02/04/2023   AGRATIO 1.8 02/04/2023   BILITOT 0.7 02/04/2023   ALKPHOS 70 02/04/2023   AST 16 02/04/2023   ALT 22 02/04/2023   Last lipids Lab Results  Component Value Date   CHOL 231 (H) 02/04/2023   HDL 38 (L) 02/04/2023   LDLCALC 140 (H) 02/04/2023   TRIG 291 (H) 02/04/2023   CHOLHDL 6.1 (H) 02/04/2023   Last hemoglobin A1c Lab Results  Component Value Date   HGBA1C 5.7 (H) 02/04/2023   Last thyroid functions Lab Results  Component Value Date   TSH 1.120 02/04/2023        Objective     Today's Vitals   02/14/23 1419  BP: 133/80  Pulse: 98  SpO2: 97%  Weight: 247 lb 1.9 oz (112.1 kg)  Height: 5' 10.8" (1.798 m)   Body  mass index is 34.66 kg/m.  BP Readings from Last 3 Encounters:  03/02/23 134/88  02/14/23 133/80  08/15/22 121/73    Wt Readings from Last 3 Encounters:  03/02/23 247 lb (112 kg)  02/14/23 247 lb 1.9  oz (112.1 kg)  08/15/22 237 lb 12.8 oz (107.9 kg)     Physical Exam Vitals and nursing note reviewed.  Constitutional:      Appearance: Normal appearance. He is well-developed.  HENT:     Head: Normocephalic and atraumatic.     Right Ear: Tympanic membrane, ear canal and external ear normal.     Left Ear: Tympanic membrane, ear canal and external ear normal.     Nose: Nose normal.     Mouth/Throat:     Mouth: Mucous membranes are moist.     Pharynx: Oropharynx is clear.  Eyes:     Conjunctiva/sclera: Conjunctivae normal.     Pupils: Pupils are equal, round, and reactive to light.  Cardiovascular:     Rate and Rhythm: Normal rate and regular rhythm.     Pulses: Normal pulses.     Heart sounds: Normal heart sounds.  Pulmonary:     Effort: Pulmonary effort is normal.     Breath sounds: Normal breath sounds.  Abdominal:     General: Bowel sounds are normal. There is no distension.     Palpations: Abdomen is soft. There is no mass.     Tenderness: There is no abdominal tenderness. There is no right CVA tenderness, left CVA tenderness, guarding or rebound.     Hernia: No hernia is present.  Musculoskeletal:        General: Normal range of motion.     Cervical back: Normal range of motion and neck supple.  Lymphadenopathy:     Cervical: No cervical adenopathy.  Skin:    General: Skin is warm and dry.     Capillary Refill: Capillary refill takes less than 2 seconds.  Neurological:     General: No focal deficit present.     Mental Status: He is alert and oriented to person, place, and time.  Psychiatric:        Attention and Perception: Attention and perception normal.        Mood and Affect: Mood is anxious and depressed.        Speech: Speech normal.        Behavior:  Behavior normal. Behavior is cooperative.        Thought Content: Thought content normal.        Cognition and Memory: Cognition and memory normal.        Judgment: Judgment normal.     Last depression screening scores   Row Labels 02/14/2023    3:08 PM 08/15/2022    2:24 PM 02/13/2022   11:41 AM  PHQ 2/9 Scores   Section Header. No data exists in this row.     PHQ - 2 Score   5 2 2   PHQ- 9 Score   20 12 11    Last fall risk screening   Row Labels 02/14/2023    3:09 PM  Fall Risk    Section Header. No data exists in this row.   Falls in the past year?   0  Number falls in past yr:   0  Injury with Fall?   0  Follow up   Falls evaluation completed     Assessment & Plan    Encounter for general adult medical examination with abnormal findings  Irritable bowel syndrome with diarrhea Assessment & Plan: Trial dicyclomine up to three times daily as needed for cramping and diarrhea -refer to GI for further evaluation.   Orders: -     Dicyclomine HCl; Take 1/2-1 tablet (  10-20 mg total) by mouth 3 (three) times daily as needed for spasms.  Dispense: 90 tablet; Refill: 1 -     Ambulatory referral to Gastroenterology  Moderate recurrent major depression [facility]) Assessment & Plan: Higher score of PHQ2/9 today    Row Labels 02/14/2023    3:08 PM 08/15/2022    2:24 PM 02/13/2022   11:41 AM  Depression screen PHQ 2/9   Section Header. No data exists in this row.     Decreased Interest   3 1 1   Down, Depressed, Hopeless   2 1 1   PHQ - 2 Score   5 2 2   Altered sleeping   2 2 0  Tired, decreased energy   1 2 1   Change in appetite   3 1 2   Feeling bad or failure about yourself    3 2 2   Trouble concentrating   2 1 1   Moving slowly or fidgety/restless   3 1 2   Suicidal thoughts   1 1 1   PHQ-9 Score   20 12 11      Recommend close follow up with psychiatry    Acute migraine Assessment & Plan: Continue to take Maxalt as needed and as prescribed  -reassess need for preventive  therapy at next visit    Mixed hyperlipidemia Assessment & Plan: Most recent lipid panel -   Lipid Panel     Component Value Date/Time   CHOL 231 (H) 02/04/2023 0846   TRIG 291 (H) 02/04/2023 0846   HDL 38 (L) 02/04/2023 0846   CHOLHDL 6.1 (H) 02/04/2023 0846   LDLCALC 140 (H) 02/04/2023 0846   LABVLDL 53 (H) 02/04/2023 0846    Recommend he limit intake of fried and fatty foods. He should increase intake of lean proteins and green leafy vegetables. Adding exercise into daily routine will also be beneficial.        Immunization History  Administered Date(s) Administered   DTaP 04/09/2000, 06/11/2000, 08/20/2000, 04/10/2001, 01/11/2004   HIB (PRP-OMP) 04/09/2000, 06/11/2000, 08/20/2000, 04/10/2001   Hepatitis A 12/06/2011, 02/14/2015   Hepatitis B 2001-01[birth year], 04/09/2000, 08/20/2000   Hpv-Unspecified 12/06/2011, 02/04/2012, 02/14/2015   IPV 04/09/2000, 06/11/2000, 08/20/2000, 01/11/2004   Influenza Inj Mdck Quad Pf 08/14/2018   Influenza,inj,Quad PF,6+ Mos 09/29/2019   MMR 04/10/2001, 01/11/2004   Meningococcal Conjugate 12/06/2011, 06/19/2016   PFIZER(Purple Top)SARS-COV-2 Vaccination 02/04/2020, 03/01/2020   Pneumococcal Conjugate-13 06/11/2000, 08/20/2000, 01/20/2002, 04/09/2002   Tdap 07/07/2010, 02/13/2022   Varicella 01/09/2001, 07/17/2006    Health Maintenance  Topic Date Due   Hepatitis C Screening  Never done   COVID-19 Vaccine (3 - 2023-24 season) 06/29/2022   INFLUENZA VACCINE  05/30/2023   DTaP/Tdap/Td (8 - Td or Tdap) 02/14/2032   HPV VACCINES  Completed   HIV Screening  Completed    Discussed health benefits of physical activity, and encouraged him to engage in regular exercise appropriate for his age and condition.   Return in about 3 months (around 05/16/2023) for IBS, HLD. check fasting lipids, CMP, and HgbA1c a week prior to next visit .        Carlean Jews, NP  [facility] at [facility] [phone] (phone) [phone]  (fax)  [facility]

## 2023-02-18 ENCOUNTER — Other Ambulatory Visit (HOSPITAL_COMMUNITY): Payer: Self-pay

## 2023-03-02 ENCOUNTER — Ambulatory Visit
Admission: EM | Admit: 2023-03-02 | Discharge: 2023-03-02 | Disposition: A | Discharge status: Home / Self Care | Payer: Commercial Managed Care - PPO | Attending: Internal Medicine | Admitting: Internal Medicine

## 2023-03-02 DIAGNOSIS — L089 Local infection of the skin and subcutaneous tissue, unspecified: Secondary | ICD-10-CM | POA: Diagnosis not present

## 2023-03-02 MED ORDER — CEPHALEXIN 500 MG PO CAPS: 500.0000 mg | ORAL_CAPSULE | Freq: Four times a day (QID) | ORAL | 0 refills | Status: AC

## 2023-03-02 NOTE — ED Triage Notes (Signed)
Here for "Right great toe infection", pain/irritation started a few wks ago "now having discharge". No injury. Some redness. No fever.

## 2023-03-02 NOTE — Discharge Instructions (Signed)
You have an infected toe.  I have prescribed an antibiotic to treat this.  Recommend following up with podiatry at provided contact information if symptoms persist or worsen.

## 2023-03-02 NOTE — ED Provider Notes (Signed)
[facility]    CSN: [id number] Arrival date & time: 03/02/23  1132      History   Chief Complaint Chief Complaint  Patient presents with   Nail Problem    Toe-Right    HPI David Sweeney is a 22 y.o. male.   Patient presents with concerns of infection to the right great toe.  Patient reports that it felt irritated a few weeks ago.  Then, he started having some purulent discharge over the past few days.  He denies any associated fever or any injury to the area.     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 Authorizing Provider  albuterol (VENTOLIN HFA) 108 (90 Base) MCG/ACT inhaler Inhale 2 puffs into the lungs every 4-6 hours as needed for cough/wheeze 09/05/22  Yes   beclomethasone (QVAR REDIHALER) 80 MCG/ACT inhaler Inhale 2 puffs into the lungs 2 (two) times daily 09/05/22  Yes   buPROPion (WELLBUTRIN XL) 150 MG 24 hr tablet Take 1 tablet (150 mg total) by mouth daily. 11/16/22  Yes Corie Chiquito, PMHNP  cephALEXin (KEFLEX) 500 MG capsule Take 1 capsule (500 mg total) by mouth 4 (four) times daily for 5 days. 03/02/23 03/07/23 Yes , Acie Fredrickson, FNP  sertraline (ZOLOFT) 100 MG tablet Take 1 tablet (100 mg total) by mouth  daily. 11/16/22  Yes Corie Chiquito, PMHNP  albuterol (VENTOLIN HFA) 108 (90 Base) MCG/ACT inhaler Inhale 2 puffs into the lungs every 4-6 hours as needed for cough/wheeze 11/09/22     brexpiprazole (REXULTI) 2 MG TABS tablet Take 1 tablet (2 mg total) by mouth every other day. Patient not taking: Reported on 02/14/2023 11/16/22   Corie Chiquito, PMHNP  cetirizine (ZYRTEC) 10 MG tablet Take 10 mg by mouth daily.    [provider]  dicyclomine (BENTYL) 20 MG tablet Take 1/2-1 tablet (10-20 mg total) by mouth 3 (three) times daily as needed for spasms. 02/14/23   Carlean Jews, NP  fluticasone (FLONASE) 50 MCG/ACT nasal spray Place 1 spray into both nostrils daily. 11/09/22     fluticasone (FLOVENT HFA) 110 MCG/ACT inhaler Inhale 2 puffs into the lungs 2 (two) times daily. 08/01/22     FLUZONE QUADRIVALENT 0.5 ML injection  07/28/22   [provider]  rizatriptan (MAXALT) 10 MG tablet Take 1 tablet (10 mg total) by mouth as needed for migraine. May repeat in 2 hours if needed 01/09/22   Carlean Jews, NP    Family History Family History  Problem Relation Age of Onset   ADD / ADHD Father    Hypertension Father    ADD / ADHD Sister    Depression Sister    ADD / ADHD Brother    ADD / ADHD Maternal Aunt  Depression Maternal Aunt    Alcohol abuse Paternal Aunt    Depression Paternal Aunt    ADD / ADHD Maternal Grandmother    Cancer Maternal Grandmother        colon, appendix   Depression Maternal Grandmother    Hypertension Maternal Grandmother    Diabetes Paternal Grandfather     Social History Social History   Tobacco Use   Smoking status: Never    Passive exposure: Current   Smokeless tobacco: Never  Vaping Use   Vaping Use: Never used  Substance Use Topics   Alcohol use: Yes    Alcohol/week: 2.0 standard drinks of alcohol    Types: 2 Cans of beer per week    Comment: Occassionally.   Drug use: Not Currently    Types: Marijuana     Allergies    Patient has no known allergies.   Review of Systems Review of Systems Per HPI  Physical Exam Triage Vital Signs ED Triage Vitals  Enc Vitals Group     BP 03/02/23 1226 (!) 144/88     Pulse Rate 03/02/23 1226 75     Resp 03/02/23 1226 18     Temp 03/02/23 1226 97.9 F (36.6 C)     Temp Source 03/02/23 1226 Oral     SpO2 03/02/23 1226 96 %     Weight 03/02/23 1223 247 lb (112 kg)     Height 03/02/23 1223 5\' 10"  (1.778 m)     Head Circumference --      Peak Flow --      Pain Score 03/02/23 1222 2     Pain Loc --      Pain Edu? --      Excl. in GC? --    No data found.  Updated Vital Signs BP 134/88 (BP Location: Left Arm)   Pulse 73   Temp 97.9 F (36.6 C) (Oral)   Resp 18   Ht 5\' 10"  (1.778 m)   Wt 247 lb (112 kg)   SpO2 97%   BMI 35.44 kg/m   Visual Acuity Right Eye Distance:   Left Eye Distance:   Bilateral Distance:    Right Eye Near:   Left Eye Near:    Bilateral Near:     Physical Exam Constitutional:      General: He is not in acute distress.    Appearance: Normal appearance. He is not toxic-appearing or diaphoretic.  HENT:     Head: Normocephalic and atraumatic.  Eyes:     Extraocular Movements: Extraocular movements intact.     Conjunctiva/sclera: Conjunctivae normal.  Pulmonary:     Effort: Pulmonary effort is normal.  Feet:     Comments: Patient has mild erythema and swelling present to the medial portion of the skin surrounding the right great toe.  No obvious purulent drainage or area of fluctuance noted.  Capillary refill and pulses intact and patient has full range of motion of toe. Neurological:     General: No focal deficit present.     Mental Status: He is alert and oriented to person, place, and time. Mental status is at baseline.  Psychiatric:        Mood and Affect: Mood normal.        Behavior: Behavior normal.        Thought Content: Thought content normal.        Judgment: Judgment normal.      UC Treatments / Results   Labs (all labs ordered are  listed, but only abnormal results are displayed) Labs Reviewed - No data to display  EKG   Radiology No results found.  Procedures Procedures (including critical care time)  Medications Ordered in UC Medications - No data to display  Initial Impression / Assessment and Plan / UC Course  I have reviewed the triage vital signs and the nursing notes.  Pertinent labs & imaging results that were available during my care of the patient were reviewed by me and considered in my medical decision making (see chart for details).     Patient either has ingrown toenail that is infected or paronychia.  Will treat with cephalexin antibiotic.  No area of fluctuance for drainage at this time.  Advised warm Epsom salt soaks and following up with podiatry at provided contact information if symptoms persist or worsen.  Discussed return precautions.  Patient verbalized understanding and was agreeable with plan. Final Clinical Impressions(s) / UC Diagnoses   Final diagnoses:  Infection of toe     Discharge Instructions      You have an infected toe.  I have prescribed an antibiotic to treat this.  Recommend following up with podiatry at provided contact information if symptoms persist or worsen.    ED Prescriptions     Medication Sig Dispense Auth. Provider   cephALEXin (KEFLEX) 500 MG capsule Take 1 capsule (500 mg total) by mouth 4 (four) times daily for 5 days. 20 capsule Gustavus Bryant, [place]      PDMP not reviewed this encounter.   Gustavus Bryant, [place] 03/02/23 1339

## 2023-03-07 ENCOUNTER — Ambulatory Visit (INDEPENDENT_AMBULATORY_CARE_PROVIDER_SITE_OTHER): Payer: Commercial Managed Care - PPO | Admitting: Mental Health

## 2023-03-07 DIAGNOSIS — F330 Major depressive disorder, recurrent, mild: Secondary | ICD-10-CM

## 2023-03-07 DIAGNOSIS — F411 Generalized anxiety disorder: Secondary | ICD-10-CM

## 2023-03-07 NOTE — Progress Notes (Signed)
[facility] psychotherapy Note  Name: David Sweeney Date: 03/07/23 MRN: [id number] DOB: Oct 29, 2001 PCP: Carlean Jews, NP  Time spent:  45 minutes  Treatment:  Individual therapy  Virtual Visit via Telehealth Note Connected with patient by a telemedicine/telehealth application, with their informed consent, and verified patient privacy and that I am speaking with the correct person using two identifiers. I discussed the limitations, risks, security and privacy concerns of performing psychotherapy and the availability of in person appointments. I also discussed with the patient that there may be a patient responsible charge related to this service. The patient expressed understanding and agreed to proceed. I discussed the treatment planning with the patient. The patient was provided an opportunity to ask questions and all were answered. The patient agreed with the plan and demonstrated an understanding of the instructions. The patient was advised to call  our office if  symptoms worsen or feel they are in a crisis state and need immediate contact.   Therapist Location: office Patient Location: home     Mental Status Exam:    Appearance:    Casual     Behavior:   Appropriate  Motor:   WNL  Speech/Language:    Clear and Coherent  Affect:   Full range   Mood:   Pleasant, anxious  Thought process:   normal  Thought content:     WNL  Sensory/Perceptual disturbances:     none  Orientation:   x4  Attention:   Good  Concentration:   Good  Memory:   Intact  Fund of knowledge:    Consistent with age and development  Insight:     Good  Judgment:    Good  Impulse Control:   Good     Reported Symptoms:  anxiety, neg self talk, stress-eating, "overly self conscious".  Risk Assessment: Danger to Self:  No Self-injurious Behavior: No Danger to Others: No Duty to Warn:no Physical Aggression / Violence:No  Access to Firearms a concern: No  Gang Involvement:No  Patient /  guardian was educated about steps to take if suicide or homicide risk level increases between visits: yes While future psychiatric events cannot be accurately predicted, the patient does not currently require acute inpatient psychiatric care and does not currently meet [facility] involuntary commitment criteria.     Medications: Current Outpatient Medications  Medication Sig Dispense Refill   albuterol (VENTOLIN HFA) 108 (90 Base) MCG/ACT inhaler Inhale 2 puffs into the lungs every 4-6 hours as needed for cough/wheeze 6.7 g 0   albuterol (VENTOLIN HFA) 108 (90 Base) MCG/ACT inhaler Inhale 2 puffs into the lungs every 4-6 hours as needed for cough/wheeze 6.7 g 0   beclomethasone (QVAR REDIHALER) 80 MCG/ACT inhaler Inhale 2 puffs into the lungs 2 (two) times daily 10.6 g 5   brexpiprazole (REXULTI) 2 MG TABS tablet Take 1 tablet (2 mg total) by mouth every other day. (Patient not taking: Reported on 02/14/2023) 45 tablet 1   buPROPion (WELLBUTRIN XL) 150 MG 24 hr tablet Take 1 tablet (150 mg total) by mouth daily. 90 tablet 1   cephALEXin (KEFLEX) 500 MG capsule Take 1 capsule (500 mg total) by mouth 4 (four) times daily for 5 days. 20 capsule 0   cetirizine (ZYRTEC) 10 MG tablet Take 10 mg by mouth daily.     dicyclomine (BENTYL) 20 MG tablet Take 1/2-1 tablet (10-20 mg total) by mouth 3 (three) times daily as needed for spasms. 90 tablet 1   fluticasone (FLONASE)  50 MCG/ACT nasal spray Place 1 spray into both nostrils daily. 16 g 5   fluticasone (FLOVENT HFA) 110 MCG/ACT inhaler Inhale 2 puffs into the lungs 2 (two) times daily. 12 g 1   FLUZONE QUADRIVALENT 0.5 ML injection      rizatriptan (MAXALT) 10 MG tablet Take 1 tablet (10 mg total) by mouth as needed for migraine. May repeat in 2 hours if needed 10 tablet 1   sertraline (ZOLOFT) 100 MG tablet Take 1 tablet (100 mg total) by mouth daily. 90 tablet 1   No current facility-administered medications for this visit.   Subjective:   Patient engaged in telehealth session via video.  Assessed progress.  He focused the session on family issues.  He went on to share how his mother got upset over this past weekend and, under the influence of alcohol, got upset at patient's father engaging in arguing to the point where patient wonders if the police might be called.  Patient stated that she yelled often and her sister, who lives with him, intervened to help de-escalate the situation.  He stated that he anger his mother had was directed toward his father due to his being unfaithful to her and her learning of this a few weeks prior.  Facilitated patient sharing feelings related where he identified primarily feeling disappointed in his father.  He stated they were able to have a discussion where he let him know and how his father expressed some understanding.  Explored the relationship he has had with his father over the years as well as with his siblings.  Explored with patient ways to cope and care for himself during this time, continuing to reach out to his girlfriend often for support.  Discussed also using his sister as support as they are close in age where he stated they have done so on 1 occasion.  Patient stated that he has been using alcohol more recently but plans to decrease his use.  Facilitated his identifying reasons fo taking this step and ways to be effective.  Intervention: supportive therapy, motivational interviewing   Diagnoses:    ICD-10-CM   1. Mild episode of recurrent major depressive disorder (HCC)  F33.0     2. GAD (generalized anxiety disorder)  F41.1           Plan: Patient is to use CBT, mindfulness and coping skills to help manage stress, and identify outlets for stress management, relaxation.  Patient to continue to utilize his support system and begin exercise.   Long-term goal:   Reduce overall level, frequency, and intensity of the feelings of emotional distress that inadvertently affects  patient's mood, functioning and relationships for at least 3 consecutive months oer patient report.    Short-term goal:  Decrease anxiety producing self talk such as thinking of the worse possible life outcome Increase assertiveness with his communication to family members to elicit more help with day-to-day chores and tasks as needed   Assessment of progress:  progressing   This record has been created using [employer].  Chart creation errors have been sought, but may not always have been located and corrected. Such creation errors do not reflect on the standard of medical care.    Waldron Session, [facility]

## 2023-03-14 DIAGNOSIS — K580 Irritable bowel syndrome with diarrhea: Secondary | ICD-10-CM | POA: Insufficient documentation

## 2023-03-14 NOTE — Assessment & Plan Note (Signed)
Most recent lipid panel -   Lipid Panel     Component Value Date/Time   CHOL 231 (H) 02/04/2023 0846   TRIG 291 (H) 02/04/2023 0846   HDL 38 (L) 02/04/2023 0846   CHOLHDL 6.1 (H) 02/04/2023 0846   LDLCALC 140 (H) 02/04/2023 0846   LABVLDL 53 (H) 02/04/2023 0846    Recommend he limit intake of fried and fatty foods. He should increase intake of lean proteins and green leafy vegetables. Adding exercise into daily routine will also be beneficial.

## 2023-03-14 NOTE — Assessment & Plan Note (Signed)
Trial dicyclomine up to three times daily as needed for cramping and diarrhea -refer to GI for further evaluation.

## 2023-03-14 NOTE — Assessment & Plan Note (Signed)
Continue to take Maxalt as needed and as prescribed  -reassess need for preventive therapy at next visit

## 2023-03-14 NOTE — Assessment & Plan Note (Addendum)
Higher score of PHQ2/9 today    Row Labels 02/14/2023    3:08 PM 08/15/2022    2:24 PM 02/13/2022   11:41 AM  Depression screen PHQ 2/9   Section Header. No data exists in this row.     Decreased Interest   3 1 1   Down, Depressed, Hopeless   2 1 1   PHQ - 2 Score   5 2 2   Altered sleeping   2 2 0  Tired, decreased energy   1 2 1   Change in appetite   3 1 2   Feeling bad or failure about yourself    3 2 2   Trouble concentrating   2 1 1   Moving slowly or fidgety/restless   3 1 2   Suicidal thoughts   1 1 1   PHQ-9 Score   20 12 11      Recommend close follow up with psychiatry

## 2023-04-09 ENCOUNTER — Ambulatory Visit (INDEPENDENT_AMBULATORY_CARE_PROVIDER_SITE_OTHER): Payer: Commercial Managed Care - PPO | Admitting: Mental Health

## 2023-04-09 DIAGNOSIS — F331 Major depressive disorder, recurrent, moderate: Secondary | ICD-10-CM | POA: Diagnosis not present

## 2023-04-09 NOTE — Progress Notes (Signed)
[facility] psychotherapy Note  Name: David Sweeney Date: 04/09/23 MRN: [id number] DOB: 08/07/2001 PCP: Carlean Jews, NP  Time spent:  46 minutes  Treatment:  Individual therapy  Virtual Visit via Telehealth Note Connected with patient by a telemedicine/telehealth application, with their informed consent, and verified patient privacy and that I am speaking with the correct person using two identifiers. I discussed the limitations, risks, security and privacy concerns of performing psychotherapy and the availability of in person appointments. I also discussed with the patient that there may be a patient responsible charge related to this service. The patient expressed understanding and agreed to proceed. I discussed the treatment planning with the patient. The patient was provided an opportunity to ask questions and all were answered. The patient agreed with the plan and demonstrated an understanding of the instructions. The patient was advised to call  our office if  symptoms worsen or feel they are in a crisis state and need immediate contact.   Therapist Location: office Patient Location: home     Mental Status Exam:    Appearance:    Casual     Behavior:   Appropriate  Motor:   WNL  Speech/Language:    Clear and Coherent  Affect:   Full range   Mood:   depressed  Thought process:   normal  Thought content:     WNL  Sensory/Perceptual disturbances:     none  Orientation:   x4  Attention:   Good  Concentration:   Good  Memory:   Intact  Fund of knowledge:    Consistent with age and development  Insight:     Good  Judgment:    Good  Impulse Control:   Good     Reported Symptoms:  anxiety, neg self talk, stress-eating, "overly self conscious".  Risk Assessment: Danger to Self:  No Self-injurious Behavior: No Danger to Others: No Duty to Warn:no Physical Aggression / Violence:No  Access to Firearms a concern: No  Gang Involvement:No  Patient / guardian was  educated about steps to take if suicide or homicide risk level increases between visits: yes While future psychiatric events cannot be accurately predicted, the patient does not currently require acute inpatient psychiatric care and does not currently meet [facility] involuntary commitment criteria.     Medications: Current Outpatient Medications  Medication Sig Dispense Refill   albuterol (VENTOLIN HFA) 108 (90 Base) MCG/ACT inhaler Inhale 2 puffs into the lungs every 4-6 hours as needed for cough/wheeze 6.7 g 0   albuterol (VENTOLIN HFA) 108 (90 Base) MCG/ACT inhaler Inhale 2 puffs into the lungs every 4-6 hours as needed for cough/wheeze 6.7 g 0   beclomethasone (QVAR REDIHALER) 80 MCG/ACT inhaler Inhale 2 puffs into the lungs 2 (two) times daily 10.6 g 5   brexpiprazole (REXULTI) 2 MG TABS tablet Take 1 tablet (2 mg total) by mouth every other day. (Patient not taking: Reported on 02/14/2023) 45 tablet 1   buPROPion (WELLBUTRIN XL) 150 MG 24 hr tablet Take 1 tablet (150 mg total) by mouth daily. 90 tablet 1   cetirizine (ZYRTEC) 10 MG tablet Take 10 mg by mouth daily.     dicyclomine (BENTYL) 20 MG tablet Take 1/2-1 tablet (10-20 mg total) by mouth 3 (three) times daily as needed for spasms. 90 tablet 1   fluticasone (FLONASE) 50 MCG/ACT nasal spray Place 1 spray into both nostrils daily. 16 g 5   fluticasone (FLOVENT HFA) 110 MCG/ACT inhaler Inhale 2 puffs into  the lungs 2 (two) times daily. 12 g 1   FLUZONE QUADRIVALENT 0.5 ML injection      rizatriptan (MAXALT) 10 MG tablet Take 1 tablet (10 mg total) by mouth as needed for migraine. May repeat in 2 hours if needed 10 tablet 1   sertraline (ZOLOFT) 100 MG tablet Take 1 tablet (100 mg total) by mouth daily. 90 tablet 1   No current facility-administered medications for this visit.   Subjective:  Patient engaged in telehealth session via video.  Patient shared he is more stressed lately. He reports that lately his family continues to  have financial stress.  He reports that he has been more assertive, looking for jobs, entry level but has not heard back from some of them.  He stated that other jobs notified him via email that he did not get employed.  Provided support and encouragement and encouraged him to consider calling the companies with which he applied to follow-up as opposed to sending emails only or waiting for them to respond after he submits his application.  He stated that he feels that he is a burden to his family due to his being unemployed and unable to help financially during this time.  Through further exploration, he denied letting his mother noted that he was making these efforts and he acknowledges that she continues to feel he has not put forth enough effort to find employment.  We explored the benefits of his letting his mother know that he has applied for several jobs, he acknowledged that she would more than likely be placed in at that he is at least trying.  Explored other aspects of his family relationships, he and his sisters continues to adjust to his parents separating, his father with apartment.  He stated he stalks his father every few days.  He continues to date his girlfriend and reports this relationship is going well.  He reports some passive SI, denies any plan or intent to harm himself.  Facilitated patient further identifying thoughts associated with his feeling more depressed recently and worked with him to reframe.  Patient was able to articulate how he is not a "failure", that he is making efforts to find employment and he makes other efforts around the house to be helpful such as completing various chores daily.   Discussed safety planning if symptoms worsen.  Made patient aware after-hours office availability.   Intervention: supportive therapy, motivational interviewing   Diagnoses:    ICD-10-CM   1. Moderate recurrent major depression (HCC)  F33.1            Plan: Patient is to use  CBT, mindfulness and coping skills to help manage stress, and identify outlets for stress management, relaxation.  Patient to continue to utilize his support system and begin exercise.   Long-term goal:   Reduce overall level, frequency, and intensity of the feelings of emotional distress that inadvertently affects patient's mood, functioning and relationships for at least 3 consecutive months oer patient report.    Short-term goal:  Decrease anxiety producing self talk such as thinking of the worse possible life outcome Increase assertiveness with his communication to family members to elicit more help with day-to-day chores and tasks as needed Improve self confidence   Assessment of progress:  progressing   This record has been created using [employer].  Chart creation errors have been sought, but may not always have been located and corrected. Such creation errors do not reflect on the standard of  medical care.    Waldron Session, [facility]

## 2023-05-09 ENCOUNTER — Other Ambulatory Visit: Payer: Self-pay

## 2023-05-14 ENCOUNTER — Ambulatory Visit (INDEPENDENT_AMBULATORY_CARE_PROVIDER_SITE_OTHER): Payer: Commercial Managed Care - PPO | Admitting: Psychiatry

## 2023-05-14 ENCOUNTER — Other Ambulatory Visit (HOSPITAL_COMMUNITY): Payer: Self-pay

## 2023-05-14 ENCOUNTER — Encounter: Payer: Self-pay | Admitting: Psychiatry

## 2023-05-14 DIAGNOSIS — F332 Major depressive disorder, recurrent severe without psychotic features: Secondary | ICD-10-CM | POA: Diagnosis not present

## 2023-05-14 DIAGNOSIS — F411 Generalized anxiety disorder: Secondary | ICD-10-CM | POA: Diagnosis not present

## 2023-05-14 MED ORDER — BUPROPION HCL ER (XL) 150 MG PO TB24
150.0000 mg | ORAL_TABLET | Freq: Every day | ORAL | 0 refills | Status: DC
Start: 2023-05-14 — End: 2023-08-14
  Filled 2023-05-14: qty 90, 90d supply, fill #0

## 2023-05-14 MED ORDER — VORTIOXETINE HBR 10 MG PO TABS
10.0000 mg | ORAL_TABLET | Freq: Every day | ORAL | 0 refills | Status: DC
Start: 2023-05-14 — End: 2023-08-14
  Filled 2023-05-14 – 2023-06-27 (×2): qty 90, 90d supply, fill #0

## 2023-05-14 MED ORDER — VORTIOXETINE HBR 5 MG PO TABS: ORAL_TABLET | ORAL | Status: DC

## 2023-05-14 NOTE — Progress Notes (Signed)
WATT GEILER [id number] [birth year] 22 y.o.  Subjective:   Patient ID:  David Sweeney is a 22 y.o. (DOB 2001/02/23) male.  Chief Complaint:  Chief Complaint  Patient presents with   Depression   Anxiety    HPI David Sweeney presents to the office today for follow-up of depression and anxiety. David Sweeney reports that David Sweeney is, "not well." David Sweeney reports recent depression and anxiety. David Sweeney reports, "I haven't been taking my medication for the last month and a half." David Sweeney reports that medication was causing sexual side effects. David Sweeney reports "a lot of anxiety generally." David Sweeney reports anxiety about finances, parents, and possibly losing insurance. David Sweeney reports periods of uncontrolled crying. David Sweeney reports persistent depression. David Sweeney reports low energy and motivation. Sleep has been ok. David Sweeney estimates sleeping 6-7 hours most nights and occasionally 8 hours. David Sweeney reports that David Sweeney has been missing meals at times due to financial constraints. David Sweeney reports that at times his appetite is low. David Sweeney reports poor concentration and focus. David Sweeney reports that David Sweeney is applying to jobs. David Sweeney is occasionally cleaning.   David Sweeney reports having SI without plan or intent. David Sweeney reports that David Sweeney continues to have suicidal thoughts when trying to distract himself. David Sweeney contracts for safety and states that David Sweeney can stay safe until next apt with therapist. Reports that David Sweeney would contact therapist if David Sweeney did not feel safe. David Sweeney reports that [facility] S[facility].   David Sweeney has been in a long-distance relationship for about 6 months. Reports partner is supportive.   Past Psychiatric Medication Trials: Clomipramine- helpful for OCD. Caused worsening depression. Lexapro Luvox Zoloft Effexor XR Rexulti- Ineffective Saphris Klonopin     AIMS    Flowsheet Row Video Visit from 11/16/2022 in [facility] Video Visit from 04/19/2022 in [facility] Office Visit from 09/05/2021 in [facility]  [facility] Office Visit from 11/08/2020 in [facility]  AIMS Total Score 1 1 0 0      GAD-7    Flowsheet Row Office Visit from 08/15/2022 in [facility] Visit from 02/13/2022 in [facility] at [facility] Visit from 01/09/2022 in [facility] at [facility]  Total GAD-7 Score 9 12 10       PHQ2-9    Flowsheet Row Office Visit from 02/14/2023 in [facility] at [facility] Visit from 08/15/2022 in [facility] at [facility] Visit from 02/13/2022 in [facility] at [facility] Visit from 01/09/2022 in [facility] at [facility] Visit from 10/13/2019 in [facility] at [facility] Total Score 5 2 2 2 2   PHQ-9 Total Score 20 12 11 14 16       Flowsheet Row ED from 03/02/2023 in [facility]  C-SSRS RISK CATEGORY Low Risk        Review of Systems:  Review of Systems  Musculoskeletal:  Negative for gait problem.  Skin:        Some recent ingrown toe nails  Neurological:        David Sweeney reports tremors when David Sweeney has not eaten that day  Psychiatric/Behavioral:         Please refer to HPI    Medications: I have reviewed the patient's current medications.  Current Outpatient Medications  Medication Sig  Dispense Refill   vortioxetine HBr (TRINTELLIX) 10 MG TABS tablet Take 1 tablet (10 mg total) by mouth daily. 90 tablet 0   vortioxetine HBr (TRINTELLIX) 5 MG TABS tablet Take 1 tablet (5 mg total) by mouth daily for 7 days, THEN 2 tablets (10 mg total) daily for 28 days.     albuterol (VENTOLIN HFA) 108 (90 Base) MCG/ACT inhaler Inhale 2 puffs into the lungs every 4-6 hours as needed for cough/wheeze (Patient not taking: Reported on 05/14/2023) 6.7 g 0   albuterol (VENTOLIN HFA) 108 (90 Base) MCG/ACT inhaler Inhale 2 puffs into the lungs every 4-6 hours  as needed for cough/wheeze (Patient not taking: Reported on 05/14/2023) 6.7 g 0   beclomethasone (QVAR REDIHALER) 80 MCG/ACT inhaler Inhale 2 puffs into the lungs 2 (two) times daily (Patient not taking: Reported on 05/14/2023) 10.6 g 5   buPROPion (WELLBUTRIN XL) 150 MG 24 hr tablet Take 1 tablet (150 mg total) by mouth daily. 90 tablet 0   cetirizine (ZYRTEC) 10 MG tablet Take 10 mg by mouth daily. (Patient not taking: Reported on 05/14/2023)     dicyclomine (BENTYL) 20 MG tablet Take 1/2-1 tablet (10-20 mg total) by mouth 3 (three) times daily as needed for spasms. (Patient not taking: Reported on 05/14/2023) 90 tablet 1   fluticasone (FLONASE) 50 MCG/ACT nasal spray Place 1 spray into both nostrils daily. (Patient not taking: Reported on 05/14/2023) 16 g 5   fluticasone (FLOVENT HFA) 110 MCG/ACT inhaler Inhale 2 puffs into the lungs 2 (two) times daily. (Patient not taking: Reported on 05/14/2023) 12 g 1   FLUZONE QUADRIVALENT 0.5 ML injection  (Patient not taking: Reported on 05/14/2023)     rizatriptan (MAXALT) 10 MG tablet Take 1 tablet (10 mg total) by mouth as needed for migraine. May repeat in 2 hours if needed (Patient not taking: Reported on 05/14/2023) 10 tablet 1   No current facility-administered medications for this visit.    Medication Side Effects: Other: Sexual side effects  Allergies: No Known Allergies  Past Medical History:  Diagnosis Date   Asthma     Past Medical History, Surgical history, Social history, and Family history were reviewed and updated as appropriate.   Please see review of systems for further details on the patient's review from today.   Objective:   Physical Exam:  There were no vitals taken for this visit.  Physical Exam Constitutional:      General: David Sweeney is not in acute distress. Musculoskeletal:        General: No deformity.  Neurological:     Mental Status: David Sweeney is alert and oriented to person, place, and time.     Coordination: Coordination  normal.  Psychiatric:        Attention and Perception: Attention and perception normal. David Sweeney does not perceive auditory or visual hallucinations.        Mood and Affect: Mood is anxious and depressed. Affect is not labile, blunt, angry or inappropriate.        Speech: Speech normal.        Behavior: Behavior normal.        Thought Content: Thought content is not paranoid or delusional. Thought content does not include homicidal ideation. Thought content does not include homicidal plan.        Cognition and Memory: Cognition and memory normal.        Judgment: Judgment normal.     Comments: Insight intact David Sweeney reports suicidal ideation without plan or intent.  Contracts for safety and reports that David Sweeney would contact therapist if David Sweeney felt David Sweeney was not safe.      Lab Review:     Component Value Date/Time   NA 139 02/04/2023 0846   K 4.0 02/04/2023 0846   CL 101 02/04/2023 0846   CO2 24 02/04/2023 0846   GLUCOSE 98 02/04/2023 0846   BUN 10 02/04/2023 0846   CREATININE 0.96 02/04/2023 0846   CALCIUM 9.6 02/04/2023 0846   PROT 7.5 02/04/2023 0846   ALBUMIN 4.8 02/04/2023 0846   AST 16 02/04/2023 0846   ALT 22 02/04/2023 0846   ALKPHOS 70 02/04/2023 0846   BILITOT 0.7 02/04/2023 0846       Component Value Date/Time   WBC 6.5 02/04/2023 0846   RBC 5.81 (H) 02/04/2023 0846   HGB 16.8 02/04/2023 0846   HCT 50.6 02/04/2023 0846   PLT 236 02/04/2023 0846   MCV 87 02/04/2023 0846   MCH 28.9 02/04/2023 0846   MCHC 33.2 02/04/2023 0846   RDW 12.2 02/04/2023 0846   LYMPHSABS 2.0 02/08/2022 0925   EOSABS 0.1 02/08/2022 0925   BASOSABS 0.0 02/08/2022 0925    No results found for: "POCLITH", "LITHIUM"   No results found for: "PHENYTOIN", "PHENOBARB", "VALPROATE", "CBMZ"   .res Assessment: Plan:   30 minutes spent dedicated to the care of this patient on the date of this encounter to include pre-visit review of records, ordering of medication, post visit documentation, and face-to-face time  with the patient discussing safety plan, side effect concerns, and possible treatment options.  Discussed potential benefits, risks, and side effects of Trintellix for depression. Discussed that it has a different mechanism of action from [facility], that David Sweeney has not had significant benefit from, and is considered to be very low risk for sexual side effects, which is why David Sweeney stopped medication. Discussed that samples are available for Trintellix and that David Sweeney can start Trintellix 5 mg daily for one week, then increase to 10 mg daily for depression. Discussed that if David Sweeney were to lose health insurance, David Sweeney may be eligible for Trintellix Patient Assistance. Script sent to his pharmacy and David Sweeney provided with copay savings card.  Patient reports that David Sweeney will resume Wellbutrin XL 150 mg daily for depression. Script sent to pharmacy to be put on file until David Sweeney determines if David Sweeney can tolerate Trintellix. Advised David Sweeney to take Trintellix with food to minimize risk of nausea.  David Sweeney reports that David Sweeney plans on following up with therapist as scheduled on 05/29/23. Patient advised to contact office with any questions, adverse effects, or acute worsening in signs and symptoms. David Sweeney to follow-up in 3 months or sooner if clinically indicated.   David Sweeney was seen today for depression and anxiety.  Diagnoses and all orders for this visit:  Severe episode of recurrent major depressive disorder, without psychotic features (HCC) -     buPROPion (WELLBUTRIN XL) 150 MG 24 hr tablet; Take 1 tablet (150 mg total) by mouth daily. -     vortioxetine HBr (TRINTELLIX) 10 MG TABS tablet; Take 1 tablet (10 mg total) by mouth daily. -     vortioxetine HBr (TRINTELLIX) 5 MG TABS tablet; Take 1 tablet (5 mg total) by mouth daily for 7 days, THEN 2 tablets (10 mg total) daily for 28 days.  GAD (generalized anxiety disorder)     Please see After Visit Summary for patient specific instructions.  Future Appointments  Date Time Provider Department Center   05/29/2023  3:00 PM Caralee Ates,  Cristal Deer, [facility] CP-CP None  06/27/2023  3:00 PM Waldron Session, [facility] CP-CP None  07/29/2023  3:00 PM Waldron Session, [facility] CP-CP None  08/14/2023  1:45 PM Corie Chiquito, PMHNP CP-CP None    No orders of the defined types were placed in this encounter.   -------------------------------

## 2023-05-16 ENCOUNTER — Ambulatory Visit: Payer: Self-pay | Admitting: Nurse Practitioner

## 2023-05-29 ENCOUNTER — Ambulatory Visit (INDEPENDENT_AMBULATORY_CARE_PROVIDER_SITE_OTHER): Payer: Commercial Managed Care - PPO | Admitting: Mental Health

## 2023-05-29 DIAGNOSIS — F331 Major depressive disorder, recurrent, moderate: Secondary | ICD-10-CM | POA: Diagnosis not present

## 2023-05-29 NOTE — Progress Notes (Signed)
[facility] psych[facility] Note  Name: David Sweeney Date: 05/29/23 MRN: [id number] DOB: Jan 27, 2001 PCP: Sandre Kitty, MD  Start time: 3: 00 p.m. stop time 3: 47 PM  Treatment:  Individual therapy  Virtual Visit via Telehealth Note Connected with patient by a telemedicine/telehealth application, with their informed consent, and verified patient privacy and that I am speaking with the correct person using two identifiers. I discussed the limitations, risks, security and privacy concerns of performing psychotherapy and the availability of in person appointments. I also discussed with the patient that there may be a patient responsible charge related to this service. The patient expressed understanding and agreed to proceed. I discussed the treatment planning with the patient. The patient was provided an opportunity to ask questions and all were answered. The patient agreed with the plan and demonstrated an understanding of the instructions. The patient was advised to call  our office if  symptoms worsen or feel they are in a crisis state and need immediate contact.   Therapist Location: office Patient Location: home     Mental Status Exam:    Appearance:    Casual     Behavior:   Appropriate  Motor:   WNL  Speech/Language:    Clear and Coherent  Affect:   Full range   Mood:   depressed  Thought process:   normal  Thought content:     WNL  Sensory/Perceptual disturbances:     none  Orientation:   x4  Attention:   Good  Concentration:   Good  Memory:   Intact  Fund of knowledge:    Consistent with age and development  Insight:     Good  Judgment:    Good  Impulse Control:   Good     Reported Symptoms:  anxiety, neg self talk, stress-eating, "overly self conscious".  Risk Assessment: Danger to Self:  No Self-injurious Behavior: No Danger to Others: No Duty to Warn:no Physical Aggression / Violence:No  Access to Firearms a concern: No  Gang Involvement:No   Patient / guardian was educated about steps to take if suicide or homicide risk level increases between visits: yes While future psychiatric events cannot be accurately predicted, the patient does not currently require acute inpatient psychiatric care and does not currently meet [facility] involuntary commitment criteria.     Medications: Current Outpatient Medications  Medication Sig Dispense Refill   albuterol (VENTOLIN HFA) 108 (90 Base) MCG/ACT inhaler Inhale 2 puffs into the lungs every 4-6 hours as needed for cough/wheeze (Patient not taking: Reported on 05/14/2023) 6.7 g 0   albuterol (VENTOLIN HFA) 108 (90 Base) MCG/ACT inhaler Inhale 2 puffs into the lungs every 4-6 hours as needed for cough/wheeze (Patient not taking: Reported on 05/14/2023) 6.7 g 0   beclomethasone (QVAR REDIHALER) 80 MCG/ACT inhaler Inhale 2 puffs into the lungs 2 (two) times daily (Patient not taking: Reported on 05/14/2023) 10.6 g 5   buPROPion (WELLBUTRIN XL) 150 MG 24 hr tablet Take 1 tablet (150 mg total) by mouth daily. 90 tablet 0   cetirizine (ZYRTEC) 10 MG tablet Take 10 mg by mouth daily. (Patient not taking: Reported on 05/14/2023)     dicyclomine (BENTYL) 20 MG tablet Take 1/2-1 tablet (10-20 mg total) by mouth 3 (three) times daily as needed for spasms. (Patient not taking: Reported on 05/14/2023) 90 tablet 1   fluticasone (FLONASE) 50 MCG/ACT nasal spray Place 1 spray into both nostrils daily. (Patient not taking: Reported on 05/14/2023) 16 g  5   fluticasone (FLOVENT HFA) 110 MCG/ACT inhaler Inhale 2 puffs into the lungs 2 (two) times daily. (Patient not taking: Reported on 05/14/2023) 12 g 1   FLUZONE QUADRIVALENT 0.5 ML injection  (Patient not taking: Reported on 05/14/2023)     rizatriptan (MAXALT) 10 MG tablet Take 1 tablet (10 mg total) by mouth as needed for migraine. May repeat in 2 hours if needed (Patient not taking: Reported on 05/14/2023) 10 tablet 1   vortioxetine HBr (TRINTELLIX) 10 MG TABS  tablet Take 1 tablet (10 mg total) by mouth daily. 90 tablet 0   vortioxetine HBr (TRINTELLIX) 5 MG TABS tablet Take 1 tablet (5 mg total) by mouth daily for 7 days, THEN 2 tablets (10 mg total) daily for 28 days.     No current facility-administered medications for this visit.   Subjective:  Patient engaged in telehealth session via video.  Assessed progress since last visit which was approximately 2 months ago.  Patient shared how his parents continue to have relational strain, remain separated.  He shared how they can get into frequent arguments still, 1 last evening.  He stated that he was able to spend some time with his father recently as well.  He identified the need to set some boundaries in the relationship with his mother as she at times will discuss facets of their relationship and how this makes patient feel uncomfortable.  He has struggled to find employment although he has put out several applications, states that his mother can sometimes make sarcastic comments about him making him feel like he is a burden.  He continues to have support from his girlfriend, they have been dating now for about 6 months.  Patient was encouraged to recognize his resilience and also his thoughtfulness as sharing a recent interaction he had with his brother. He reports sleep is adequate getting about 7 hours per night.  Intervention: supportive therapy, motivational interviewing   Diagnoses:    ICD-10-CM   1. Moderate recurrent major depression (HCC)  F33.1         Plan: Patient is to use CBT, mindfulness and coping skills to help manage stress, and identify outlets for stress management, relaxation.  Patient to continue to utilize his support system and begin exercise.   Long-term goal:   Reduce overall level, frequency, and intensity of the feelings of emotional distress that inadvertently affects patient's mood, functioning and relationships for at least 3 consecutive months oer patient report.     Short-term goal:  Decrease anxiety producing self talk such as thinking of the worse possible life outcome Increase assertiveness with his communication to family members to elicit more help with day-to-day chores and tasks as needed Improve self confidence   Assessment of progress:  progressing   This record has been created using [employer].  Chart creation errors have been sought, but may not always have been located and corrected. Such creation errors do not reflect on the standard of medical care.    Waldron Session, [facility]

## 2023-06-27 ENCOUNTER — Other Ambulatory Visit (HOSPITAL_COMMUNITY): Payer: Self-pay

## 2023-06-27 ENCOUNTER — Ambulatory Visit (INDEPENDENT_AMBULATORY_CARE_PROVIDER_SITE_OTHER): Payer: Commercial Managed Care - PPO | Admitting: Mental Health

## 2023-06-27 DIAGNOSIS — F331 Major depressive disorder, recurrent, moderate: Secondary | ICD-10-CM | POA: Diagnosis not present

## 2023-06-27 NOTE — Progress Notes (Signed)
[facility] Note  Name: David Sweeney Date: 06/27/23 MRN: [id number] DOB: [birth year] PCP: Sandre Kitty, MD  Start time: 3: 00 p.m. stop time 3: 46 PM  Treatment:  Individual therapy  Virtual Visit via Telehealth Note Connected with patient by a telemedicine/telehealth application, with their informed consent, and verified patient privacy and that I am speaking with the correct person using two identifiers. I discussed the limitations, risks, security and privacy concerns of performing psychotherapy and the availability of in person appointments. I also discussed with the patient that there may be a patient responsible charge related to this service. The patient expressed understanding and agreed to proceed. I discussed the treatment planning with the patient. The patient was provided an opportunity to ask questions and all were answered. The patient agreed with the plan and demonstrated an understanding of the instructions. The patient was advised to call  our office if  symptoms worsen or feel they are in a crisis state and need immediate contact.   Therapist Location: office Patient Location: home     Mental Status Exam:    Appearance:    Casual     Behavior:   Appropriate  Motor:   WNL  Speech/Language:    Clear and Coherent  Affect:   Full range   Mood:   depressed  Thought process:   normal  Thought content:     WNL  Sensory/Perceptual disturbances:     none  Orientation:   x4  Attention:   Good  Concentration:   Good  Memory:   Intact  Fund of knowledge:    Consistent with age and development  Insight:     Good  Judgment:    Good  Impulse Control:   Good     Reported Symptoms:  anxiety, neg self talk, stress-eating, "overly self conscious".  Risk Assessment: Danger to Self:  No Self-injurious Behavior: No Danger to Others: No Duty to Warn:no Physical Aggression / Violence:No  Access to Firearms a concern: No  Gang Involvement:No   Patient / guardian was educated about steps to take if suicide or homicide risk level increases between visits: yes While future psychiatric events cannot be accurately predicted, the patient does not currently require acute inpatient psychiatric care and does not currently meet [facility] involuntary commitment criteria.     Medications: Current Outpatient Medications  Medication Sig Dispense Refill   albuterol (VENTOLIN HFA) 108 (90 Base) MCG/ACT inhaler Inhale 2 puffs into the lungs every 4-6 hours as needed for cough/wheeze (Patient not taking: Reported on 05/14/2023) 6.7 g 0   albuterol (VENTOLIN HFA) 108 (90 Base) MCG/ACT inhaler Inhale 2 puffs into the lungs every 4-6 hours as needed for cough/wheeze (Patient not taking: Reported on 05/14/2023) 6.7 g 0   beclomethasone (QVAR REDIHALER) 80 MCG/ACT inhaler Inhale 2 puffs into the lungs 2 (two) times daily (Patient not taking: Reported on 05/14/2023) 10.6 g 5   buPROPion (WELLBUTRIN XL) 150 MG 24 hr tablet Take 1 tablet (150 mg total) by mouth daily. 90 tablet 0   cetirizine (ZYRTEC) 10 MG tablet Take 10 mg by mouth daily. (Patient not taking: Reported on 05/14/2023)     dicyclomine (BENTYL) 20 MG tablet Take 1/2-1 tablet (10-20 mg total) by mouth 3 (three) times daily as needed for spasms. (Patient not taking: Reported on 05/14/2023) 90 tablet 1   fluticasone (FLONASE) 50 MCG/ACT nasal spray Place 1 spray into both nostrils daily. (Patient not taking: Reported on 05/14/2023) 16 g  5   fluticasone (FLOVENT HFA) 110 MCG/ACT inhaler Inhale 2 puffs into the lungs 2 (two) times daily. (Patient not taking: Reported on 05/14/2023) 12 g 1   FLUZONE QUADRIVALENT 0.5 ML injection  (Patient not taking: Reported on 05/14/2023)     rizatriptan (MAXALT) 10 MG tablet Take 1 tablet (10 mg total) by mouth as needed for migraine. May repeat in 2 hours if needed (Patient not taking: Reported on 05/14/2023) 10 tablet 1   vortioxetine HBr (TRINTELLIX) 10 MG TABS  tablet Take 1 tablet (10 mg total) by mouth daily. 90 tablet 0   vortioxetine HBr (TRINTELLIX) 5 MG TABS tablet Take 1 tablet (5 mg total) by mouth daily for 7 days, THEN 2 tablets (10 mg total) daily for 28 days.     No current facility-administered medications for this visit.   Subjective:  Patient engaged in telehealth session via telephone.  Assessed progress since last visit which was approximately 1 month ago. He described his mood as "not great".  He stated his mother was suicidal about a month ago and was inpatient for about a week. He stated when she returned home she was stable. He stated she has been gone a lot, staying at her husband's apartment, going to hang out with friends.  He stated he feels she is going through a midlife crisis and how everyone, including his sister and brother are trying to adjust. He stated he is excited that he did get a temporary job for the next 2 months. He stated his sleep is inconsistent, encouraged a regular sleep regimen to promote better sleep.  Encouraged patient to also follow up with his pharmacy as he stated that his medications were out currently and to also follow up with his mother as he stated that she could help him probably get his medications picked up from the pharmacy.  Assess other relationships, continues to date his girlfriend, reports some relationship issues at times where he stated she wants him to be more supportive and interested in her.  When he has to do so were explored collaboratively as he stated he wants to keep this relationship.   Intervention: supportive therapy, motivational interviewing   Diagnoses:    ICD-10-CM   1. Moderate recurrent major depression (HCC)  F33.1          Plan: Patient is to use CBT, mindfulness and coping skills to help manage stress, and identify outlets for stress management, relaxation.  Patient to continue to utilize his support system and begin exercise.   Long-term goal:   Reduce  overall level, frequency, and intensity of the feelings of emotional distress that inadvertently affects patient's mood, functioning and relationships for at least 3 consecutive months oer patient report.    Short-term goal:  Decrease anxiety producing self talk such as thinking of the worse possible life outcome Increase assertiveness with his communication to family members to elicit more help with day-to-day chores and tasks as needed Improve self confidence   Assessment of progress:  progressing   This record has been created using [employer].  Chart creation errors have been sought, but may not always have been located and corrected. Such creation errors do not reflect on the standard of medical care.    Waldron Session, [facility]

## 2023-07-02 ENCOUNTER — Other Ambulatory Visit (HOSPITAL_COMMUNITY): Payer: Self-pay

## 2023-07-08 ENCOUNTER — Other Ambulatory Visit (HOSPITAL_COMMUNITY): Payer: Self-pay

## 2023-07-29 ENCOUNTER — Ambulatory Visit: Payer: Commercial Managed Care - PPO | Admitting: Mental Health

## 2023-08-14 ENCOUNTER — Ambulatory Visit (INDEPENDENT_AMBULATORY_CARE_PROVIDER_SITE_OTHER): Payer: Commercial Managed Care - PPO | Admitting: Psychiatry

## 2023-08-14 ENCOUNTER — Other Ambulatory Visit (HOSPITAL_COMMUNITY): Payer: Self-pay

## 2023-08-14 ENCOUNTER — Encounter: Payer: Self-pay | Admitting: Psychiatry

## 2023-08-14 DIAGNOSIS — F411 Generalized anxiety disorder: Secondary | ICD-10-CM

## 2023-08-14 DIAGNOSIS — F422 Mixed obsessional thoughts and acts: Secondary | ICD-10-CM

## 2023-08-14 DIAGNOSIS — F330 Major depressive disorder, recurrent, mild: Secondary | ICD-10-CM

## 2023-08-14 MED ORDER — BUPROPION HCL ER (XL) 150 MG PO TB24
150.0000 mg | ORAL_TABLET | Freq: Every day | ORAL | 1 refills | Status: DC
Start: 2023-08-14 — End: 2023-11-22
  Filled 2023-08-14 – 2023-09-09 (×2): qty 90, 90d supply, fill #0

## 2023-08-14 MED ORDER — VORTIOXETINE HBR 10 MG PO TABS
10.0000 mg | ORAL_TABLET | Freq: Every day | ORAL | 1 refills | Status: DC
Start: 2023-08-14 — End: 2023-11-22
  Filled 2023-08-14 – 2023-10-17 (×5): qty 90, 90d supply, fill #0

## 2023-08-14 NOTE — Progress Notes (Signed)
David Sweeney [id number] 06/21/2001 22 y.o.  Subjective:   Patient ID:  David Sweeney is a 22 y.o. (DOB [birth year]) male.  Chief Complaint:  Chief Complaint  Patient presents with   Follow-up    Depression, Anxiety    HPI David Sweeney presents to the office today for follow-up of anxiety and depression.   He reports that he was able to start Trintellix- "it works." He reports that he had a month lapse between samples and script. He reports that he had some nausea with starting Trintellix. He reports that he occasionally continues to have nausea at times.   He reports depression has improved some. He reports some continued depression in response to stressors. Cat went missing and later returned. He reports energy and motivation have improved some and submitted an application. Energy and motivation have been low. Anxiety has been "high...more generalized anxiety."He reports some occ physical symptoms with anxiety.  Denies panic. He reports that his sleep has been "hit or miss." He reports that sleep schedule is inconsistent- ie., may unintentionally fall asleep early some nights and other nights does not fall asleep until later. He reports that he has recently been experiencing increased hunger and then feeling guilty afterwards. Some feelings of guilt and feeling like he lets his girlfriend down. Concentration varies- "some days I have been really focused" and other days cannot sustain focus. Reports improvement in suicidal thoughts with occasional passive death wishes. Denies suicidal ideation.   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   Past Psychiatric Medication Trials: Clomipramine- helpful for OCD. Caused worsening depression. Lexapro Luvox Zoloft Effexor XR Rexulti- Ineffective Saphris Klonopin  AIMS    Flowsheet Row Video Visit from 11/16/2022 in [facility] Video Visit from 04/19/2022 in [facility] Office Visit from  09/05/2021 in [facility] Office Visit from 11/08/2020 in [facility]  AIMS Total Score 1 1 0 0      GAD-7    Flowsheet Row Office Visit from 08/15/2022 in [facility] at [facility] Visit from 02/13/2022 in [facility] at [facility] Visit from 01/09/2022 in [facility] Primary Care at [facility]  Total GAD-7 Score 9 12 10       PHQ2-9    Flowsheet Row Office Visit from 02/14/2023 in [facility] at [facility] Visit from 08/15/2022 in [facility] at [facility] Visit from 02/13/2022 in [facility] Care at [facility] Visit from 01/09/2022 in [facility] at [facility] Visit from 10/13/2019 in [facility] at [facility] Total Score 5 2 2 2 2   PHQ-9 Total Score 20 12 11 14 16       Flowsheet Row ED from 03/02/2023 in [facility])  C-SSRS RISK CATEGORY Low Risk        Review of Systems:  Review of Systems  Gastrointestinal:  Positive for nausea.  Musculoskeletal:  Negative for gait problem.  Allergic/Immunologic: Positive for environmental allergies.  Psychiatric/Behavioral:         Please refer to HPI    Medications: I have reviewed the patient's current medications.  Current Outpatient Medications  Medication Sig Dispense Refill   albuterol (VENTOLIN HFA) 108 (90 Base) MCG/ACT inhaler Inhale 2 puffs into the lungs every 4-6 hours as needed for cough/wheeze 6.7 g 0  cetirizine (ZYRTEC) 10 MG tablet Take 10 mg by mouth daily.     diphenhydrAMINE (BENADRYL) 25 mg capsule Take 25 mg by mouth every 6 (six) hours as needed.     fluticasone (FLONASE) 50 MCG/ACT nasal spray Place 1 spray into both nostrils daily. 16 g 5   fluticasone (FLOVENT HFA) 110 MCG/ACT inhaler Inhale 2 puffs into the lungs 2 (two) times daily. 12 g 1   albuterol (VENTOLIN HFA) 108 (90  Base) MCG/ACT inhaler Inhale 2 puffs into the lungs every 4-6 hours as needed for cough/wheeze (Patient not taking: Reported on 05/14/2023) 6.7 g 0   beclomethasone (QVAR REDIHALER) 80 MCG/ACT inhaler Inhale 2 puffs into the lungs 2 (two) times daily (Patient not taking: Reported on 05/14/2023) 10.6 g 5   buPROPion (WELLBUTRIN XL) 150 MG 24 hr tablet Take 1 tablet (150 mg total) by mouth daily. 90 tablet 1   dicyclomine (BENTYL) 20 MG tablet Take 1/2-1 tablet (10-20 mg total) by mouth 3 (three) times daily as needed for spasms. (Patient not taking: Reported on 05/14/2023) 90 tablet 1   FLUZONE QUADRIVALENT 0.5 ML injection  (Patient not taking: Reported on 05/14/2023)     rizatriptan (MAXALT) 10 MG tablet Take 1 tablet (10 mg total) by mouth as needed for migraine. May repeat in 2 hours if needed (Patient not taking: Reported on 05/14/2023) 10 tablet 1   vortioxetine HBr (TRINTELLIX) 10 MG TABS tablet Take 1 tablet (10 mg total) by mouth daily. 90 tablet 1   No current facility-administered medications for this visit.    Medication Side Effects: Other: Occ nausea  Allergies: No Known Allergies  Past Medical History:  Diagnosis Date   Asthma     Past Medical History, Surgical history, Social history, and Family history were reviewed and updated as appropriate.   Please see review of systems for further details on the patient's review from today.   Objective:   Physical Exam:  There were no vitals taken for this visit.  Physical Exam Constitutional:      General: He is not in acute distress. Musculoskeletal:        General: No deformity.  Neurological:     Mental Status: He is alert and oriented to person, place, and time.     Coordination: Coordination normal.  Psychiatric:        Attention and Perception: Attention and perception normal. He does not perceive auditory or visual hallucinations.        Mood and Affect: Affect is not labile, blunt, angry or inappropriate.         Speech: Speech normal.        Behavior: Behavior normal.        Thought Content: Thought content normal. Thought content is not paranoid or delusional. Thought content does not include homicidal or suicidal ideation. Thought content does not include homicidal or suicidal plan.        Cognition and Memory: Cognition and memory normal.        Judgment: Judgment normal.     Comments: Insight intact Mood presents as less depressed and anxious compared to last exam. Some continued sadness and anxiety in response to significant psychosocial stressors     Lab Review:     Component Value Date/Time   NA 139 02/04/2023 0846   K 4.0 02/04/2023 0846   CL 101 02/04/2023 0846   CO2 24 02/04/2023 0846   GLUCOSE 98 02/04/2023 0846   BUN 10 02/04/2023 0846   CREATININE  0.96 02/04/2023 0846   CALCIUM 9.6 02/04/2023 0846   PROT 7.5 02/04/2023 0846   ALBUMIN 4.8 02/04/2023 0846   AST 16 02/04/2023 0846   ALT 22 02/04/2023 0846   ALKPHOS 70 02/04/2023 0846   BILITOT 0.7 02/04/2023 0846       Component Value Date/Time   WBC 6.5 02/04/2023 0846   RBC 5.81 (H) 02/04/2023 0846   HGB 16.8 02/04/2023 0846   HCT 50.6 02/04/2023 0846   PLT 236 02/04/2023 0846   MCV 87 02/04/2023 0846   MCH 28.9 02/04/2023 0846   MCHC 33.2 02/04/2023 0846   RDW 12.2 02/04/2023 0846   LYMPHSABS 2.0 02/08/2022 0925   EOSABS 0.1 02/08/2022 0925   BASOSABS 0.0 02/08/2022 0925    No results found for: "POCLITH", "LITHIUM"   No results found for: "PHENYTOIN", "PHENOBARB", "VALPROATE", "CBMZ"   .res Assessment: Plan:    29 minutes spent dedicated to the care of this patient on the date of this encounter to include pre-visit review of records, ordering of medication, post visit documentation, and face-to-face time with the patient discussing response to trintellix and ongoing treatment plan. Discussed option to continue Trintellix 10 mg daily or increase to 20 mg daily, which may be more likely to worsen nausea. Pt  reports that he would prefer to continue Trintellix 10 mg daily at this time and may consider dose increase in the future if needed and nausea has completely subsided.  Continue Wellbutrin XL 150 mg daily for depression.  Recommend continuing psychotherapy with Elio Forget, [facility]  Pt to follow-up in 3 months or sooner if clinically indicated.  Patient advised to contact office with any questions, adverse effects, or acute worsening in signs and symptoms.    David Sweeney was seen today for follow-up.  Diagnoses and all orders for this visit:  Mild episode of recurrent major depressive disorder (HCC) -     vortioxetine HBr (TRINTELLIX) 10 MG TABS tablet; Take 1 tablet (10 mg total) by mouth daily. -     buPROPion (WELLBUTRIN XL) 150 MG 24 hr tablet; Take 1 tablet (150 mg total) by mouth daily.  GAD (generalized anxiety disorder)  Mixed obsessional thoughts and acts     Please see After Visit Summary for patient specific instructions.  Future Appointments  Date Time Provider Department Center  08/26/2023  1:00 PM Waldron Session, [facility] CP-CP None  09/23/2023  2:00 PM Waldron Session, [facility] CP-CP None  10/28/2023  2:00 PM Waldron Session, [facility] CP-CP None  11/14/2023  2:30 PM Corie Chiquito, [facility]NP CP-CP None    No orders of the defined types were placed in this encounter.   -------------------------------

## 2023-08-15 ENCOUNTER — Other Ambulatory Visit (HOSPITAL_COMMUNITY): Payer: Self-pay

## 2023-08-26 ENCOUNTER — Other Ambulatory Visit (HOSPITAL_COMMUNITY): Payer: Self-pay

## 2023-08-26 ENCOUNTER — Ambulatory Visit: Payer: Commercial Managed Care - PPO | Admitting: Mental Health

## 2023-08-26 DIAGNOSIS — F330 Major depressive disorder, recurrent, mild: Secondary | ICD-10-CM | POA: Diagnosis not present

## 2023-08-26 NOTE — Progress Notes (Signed)
[facility]  Name: David Sweeney Date: 08/26/23 MRN: [id number] DOB: [birth year] PCP: Sandre Kitty, MD  Time spent:  49 minutes  Treatment:  Individual therapy  Virtual Visit via Telehealth Note Connected with patient by a telemedicine/telehealth application, with their informed consent, and verified patient privacy and that I am speaking with the correct person using two identifiers. I discussed the limitations, risks, security and privacy concerns of performing psychotherapy and the availability of in person appointments. I also discussed with the patient that there may be a patient responsible charge related to this service. The patient expressed understanding and agreed to proceed. I discussed the treatment planning with the patient. The patient was provided an opportunity to ask questions and all were answered. The patient agreed with the plan and demonstrated an understanding of the instructions. The patient was advised to call  our office if  symptoms worsen or feel they are in a crisis state and need immediate contact.   Therapist Location: office Patient Location: home     Mental Status Exam:    Appearance:    Casual     Behavior:   Appropriate  Motor:   WNL  Speech/Language:    Clear and Coherent  Affect:   Full range   Mood:   depressed  Thought process:   normal  Thought content:     WNL  Sensory/Perceptual disturbances:     none  Orientation:   x4  Attention:   Good  Concentration:   Good  Memory:   Intact  Fund of knowledge:    Consistent with age and development  Insight:     Good  Judgment:    Good  Impulse Control:   Good     Reported Symptoms:  anxiety, neg self talk, stress-eating, "overly self conscious".  Risk Assessment: Danger to Self:  No Self-injurious Behavior: No Danger to Others: No Duty to Warn:no Physical Aggression / Violence:No  Access to Firearms a concern: No  Gang Involvement:No  Patient / guardian was  educated about steps to take if suicide or homicide risk level increases between visits: yes While future psychiatric events cannot be accurately predicted, the patient does not currently require acute inpatient psychiatric care and does not currently meet [facility] involuntary commitment criteria.     Medications: Current Outpatient Medications  Medication Sig Dispense Refill   albuterol (VENTOLIN HFA) 108 (90 Base) MCG/ACT inhaler Inhale 2 puffs into the lungs every 4-6 hours as needed for cough/wheeze 6.7 g 0   albuterol (VENTOLIN HFA) 108 (90 Base) MCG/ACT inhaler Inhale 2 puffs into the lungs every 4-6 hours as needed for cough/wheeze (Patient not taking: Reported on 05/14/2023) 6.7 g 0   beclomethasone (QVAR REDIHALER) 80 MCG/ACT inhaler Inhale 2 puffs into the lungs 2 (two) times daily (Patient not taking: Reported on 05/14/2023) 10.6 g 5   buPROPion (WELLBUTRIN XL) 150 MG 24 hr tablet Take 1 tablet (150 mg total) by mouth daily. 90 tablet 1   cetirizine (ZYRTEC) 10 MG tablet Take 10 mg by mouth daily.     dicyclomine (BENTYL) 20 MG tablet Take 1/2-1 tablet (10-20 mg total) by mouth 3 (three) times daily as needed for spasms. (Patient not taking: Reported on 05/14/2023) 90 tablet 1   diphenhydrAMINE (BENADRYL) 25 mg capsule Take 25 mg by mouth every 6 (six) hours as needed.     fluticasone (FLONASE) 50 MCG/ACT nasal spray Place 1 spray into both nostrils daily. 16 g 5  fluticasone (FLOVENT HFA) 110 MCG/ACT inhaler Inhale 2 puffs into the lungs 2 (two) times daily. 12 g 1   FLUZONE QUADRIVALENT 0.5 ML injection  (Patient not taking: Reported on 05/14/2023)     rizatriptan (MAXALT) 10 MG tablet Take 1 tablet (10 mg total) by mouth as needed for migraine. May repeat in 2 hours if needed (Patient not taking: Reported on 05/14/2023) 10 tablet 1   vortioxetine HBr (TRINTELLIX) 10 MG TABS tablet Take 1 tablet (10 mg total) by mouth daily. 90 tablet 1   No current facility-administered  medications for this visit.   Subjective:  Patient engaged in telehealth session via video.  Assessed progress since last visit which was approximately 2 months ago.  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  He stated this has been helpful towards working on his anxiety.  He stated that it helps to have a prescription to read when interacting with the public as this would historically be more difficult for patient.  Facilitated his identifying what he has come to feel more comfortable with, experiences and feelings.  Assess family relationships.  He stated that he recently learned that his father his father was diagnosed with narcissistic personality disorder officially, how this was not surprising for patient as he shared some experiences over many years with his father. He stated his mother has mood swings, his grM has bipolar and patient speculates if his mother also may cope with this disorder.  He continues to avoid his mother most often when she is at home as he stated she either "builds me up or tears me down".  Patient shared how she may give him positive feedback, but if he does anything incorrect such as not put the laundry away she can say things that are hurtful and shameful towards patient.  Space was provided for patient to further explore feelings related, experiences.  Further facilitated his identifying self supportive talk, progress he is making as he feels good about himself working, his sleeping better, more consistently with his sleep regimen as well.  He continues to also date his girlfriend which he states this relationship is also going well.  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  Intervention: supportive therapy, motivational interviewing, CBT   Diagnoses:    ICD-10-CM   1. Mild episode of recurrent major depressive  disorder (HCC)  F33.0       Plan: Patient is to use CBT, mindfulness and coping skills to help manage stress, and identify outlets for stress management, relaxation.  Patient to continue to utilize his support system and begin exercise.   Long-term goal:   Reduce overall level, frequency, and intensity of the feelings of emotional distress that inadvertently affects patient's mood, functioning and relationships for at least 3 consecutive months oer patient report.    Short-term goal:  Decrease anxiety producing self talk such as thinking of the worse possible life outcome Increase assertiveness with his communication to family members to elicit more help with day-to-day chores and tasks as needed Improve self confidence   Assessment of progress:  progressing   Waldron Session, [facility]

## 2023-09-05 ENCOUNTER — Telehealth: Payer: Self-pay | Admitting: Mental Health

## 2023-09-05 NOTE — Telephone Encounter (Signed)
Jermine called in at 7:45am and was asking if Thayer Ohm had any openings. He said that "he felt lost and didn't know what to do."  I called him back at 10:15am and told him I could add him to a wait list and at least let Thayer Ohm know how he is doing. I asked if he was having any thoughts of harm and he said not right now, he didn't want to harm himself. I asked if he had family or friends around him that he could go to if he didn't feel safe. He said he lives with several roommates and I asked if he felt comfortable going to them if he felt worse. He said yes he thought so. I told him to call us immediately if he started to feel worse. Just FYI.

## 2023-09-05 NOTE — Telephone Encounter (Signed)
Ok. If something changes, please let me know and I will work him in as soon as possible.

## 2023-09-05 NOTE — Telephone Encounter (Signed)
I called patient to see if there was anything that Shanda Bumps could recommend. He was not forthcoming with any details, said he just needed some advice from [place]. He did not feel like he needed any adjustments in medications.

## 2023-09-10 ENCOUNTER — Other Ambulatory Visit (HOSPITAL_COMMUNITY): Payer: Self-pay

## 2023-09-11 ENCOUNTER — Encounter: Payer: Self-pay | Admitting: Psychiatry

## 2023-09-11 ENCOUNTER — Other Ambulatory Visit (HOSPITAL_COMMUNITY): Payer: Self-pay

## 2023-09-23 ENCOUNTER — Ambulatory Visit: Payer: Commercial Managed Care - PPO | Admitting: Mental Health

## 2023-09-23 DIAGNOSIS — F330 Major depressive disorder, recurrent, mild: Secondary | ICD-10-CM

## 2023-09-23 NOTE — Progress Notes (Unsigned)
[facility] Counsel[facility] psychotherapy Note  Name: David Sweeney Date: 09/23/23 MRN: [id number] DOB: September 17, 2001 PCP: Sandre Kitty, MD  Time spent:  48 minutes  Treatment:  Individual therapy  Virtual Visit via Telehealth Note Connected with patient by a telemedicine/telehealth application, with their informed consent, and verified patient privacy and that I am speaking with the correct person using two identifiers. I discussed the limitations, risks, security and privacy concerns of performing psychotherapy and the availability of in person appointments. I also discussed with the patient that there may be a patient responsible charge related to this service. The patient expressed understanding and agreed to proceed. I discussed the treatment planning with the patient. The patient was provided an opportunity to ask questions and all were answered. The patient agreed with the plan and demonstrated an understanding of the instructions. The patient was advised to call  our office if  symptoms worsen or feel they are in a crisis state and need immediate contact.   Therapist Location: office Patient Location: home     Mental Status Exam:    Appearance:    Casual     Behavior:   Appropriate  Motor:   WNL  Speech/Language:    Clear and Coherent  Affect:   Full range   Mood:   depressed  Thought process:   normal  Thought content:     WNL  Sensory/Perceptual disturbances:     none  Orientation:   x4  Attention:   Good  Concentration:   Good  Memory:   Intact  Fund of knowledge:    Consistent with age and development  Insight:     Good  Judgment:    Good  Impulse Control:   Good     Reported Symptoms:  anxiety, neg self talk, stress-eating, "overly self conscious".  Risk Assessment: Danger to Self:  No Self-injurious Behavior: No Danger to Others: No Duty to Warn:no Physical Aggression / Violence:No  Access to Firearms a concern: No  Gang Involvement:No  Patient / guardian was  educated about steps to take if suicide or homicide risk level increases between visits: yes While future psychiatric events cannot be accurately predicted, the patient does not currently require acute inpatient psychiatric care and does not currently meet [facility] involuntary commitment criteria.     Medications: Current Outpatient Medications  Medication Sig Dispense Refill   albuterol (VENTOLIN HFA) 108 (90 Base) MCG/ACT inhaler Inhale 2 puffs into the lungs every 4-6 hours as needed for cough/wheeze 6.7 g 0   albuterol (VENTOLIN HFA) 108 (90 Base) MCG/ACT inhaler Inhale 2 puffs into the lungs every 4-6 hours as needed for cough/wheeze (Patient not taking: Reported on 05/14/2023) 6.7 g 0   beclomethasone (QVAR REDIHALER) 80 MCG/ACT inhaler Inhale 2 puffs into the lungs 2 (two) times daily (Patient not taking: Reported on 05/14/2023) 10.6 g 5   buPROPion (WELLBUTRIN XL) 150 MG 24 hr tablet Take 1 tablet (150 mg total) by mouth daily. 90 tablet 1   cetirizine (ZYRTEC) 10 MG tablet Take 10 mg by mouth daily.     dicyclomine (BENTYL) 20 MG tablet Take 1/2-1 tablet (10-20 mg total) by mouth 3 (three) times daily as needed for spasms. (Patient not taking: Reported on 05/14/2023) 90 tablet 1   diphenhydrAMINE (BENADRYL) 25 mg capsule Take 25 mg by mouth every 6 (six) hours as needed.     fluticasone (FLONASE) 50 MCG/ACT nasal spray Place 1 spray into both nostrils daily. 16 g 5  fluticasone (FLOVENT HFA) 110 MCG/ACT inhaler Inhale 2 puffs into the lungs 2 (two) times daily. 12 g 1   FLUZONE QUADRIVALENT 0.5 ML injection  (Patient not taking: Reported on 05/14/2023)     rizatriptan (MAXALT) 10 MG tablet Take 1 tablet (10 mg total) by mouth as needed for migraine. May repeat in 2 hours if needed (Patient not taking: Reported on 05/14/2023) 10 tablet 1   vortioxetine HBr (TRINTELLIX) 10 MG TABS tablet Take 1 tablet (10 mg total) by mouth daily. 90 tablet 1   No current facility-administered  medications for this visit.   Subjective:  Patient engaged in telehealth session via telephone.  Assessed progress since last visit.  He shared some distressful feelings related to the outcome of the recent election.  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  Since that time, he has continued to seek other employment options, hopes to get an interview soon.  He identified having a sense of improved confidence somewhat when talking to others socially, some less anxiety which was a positive outcome, along with his financial compensation. Through further guided discovery, he is considering to going back college.  Continues to have support from his girlfriend and plans to see his girlfriend next March, he stated this has been a "bright spot" in his life when compared to some of the ongoing family issues at home.  He stated that his aunt and uncle were told to move out to an apartment by his parents, this concerning patient as he stated they have significant financial stress.  Patient continues to worry about his parents relationship as they have had difficulties over the last several months, separation.  Ways to cope and care for himself, and cope with the stressor was explored collaboratively.    Intervention: supportive therapy, motivational interviewing, CBT   Diagnoses:    ICD-10-CM   1. Mild episode of recurrent major depressive disorder (HCC)  F33.0        Plan: Patient is to use CBT, mindfulness and coping skills to help manage stress, and identify outlets for stress management, relaxation.  Patient to continue to utilize his support system and begin exercise.   Long-term goal:   Reduce overall level, frequency, and intensity of the feelings of emotional distress that inadvertently affects patient's mood, functioning and relationships for at least 3 consecutive months  oer patient report.    Short-term goal:  Decrease anxiety producing self talk such as thinking of the worse possible life outcome Increase assertiveness with his communication to family members to elicit more help with day-to-day chores and tasks as needed Improve self confidence   Assessment of progress:  progressing   Waldron Session, [facility]

## 2023-09-24 NOTE — Addendum Note (Signed)
Addended by: Waldron Session D on: 09/24/2023 09:27 AM   Modules accepted: Level of Service

## 2023-10-14 ENCOUNTER — Other Ambulatory Visit (HOSPITAL_COMMUNITY): Payer: Self-pay

## 2023-10-17 ENCOUNTER — Other Ambulatory Visit (HOSPITAL_COMMUNITY): Payer: Self-pay

## 2023-10-28 ENCOUNTER — Ambulatory Visit (INDEPENDENT_AMBULATORY_CARE_PROVIDER_SITE_OTHER): Payer: Commercial Managed Care - PPO | Admitting: Mental Health

## 2023-10-28 DIAGNOSIS — F330 Major depressive disorder, recurrent, mild: Secondary | ICD-10-CM

## 2023-10-28 NOTE — Progress Notes (Signed)
[facility] Counsel[facility] psychotherapy Note  Name: TRAMPAS RAVENS Date: 10/28/23 MRN: [id number] DOB: 07-18-2001 PCP: Sandre Kitty, MD  Time spent:  48 minutes  Treatment:  Individual therapy   Mental Status Exam:    Appearance:    Casual     Behavior:   Appropriate  Motor:   WNL  Speech/Language:    Clear and Coherent  Affect:   Full range   Mood:   depressed  Thought process:   normal  Thought content:     WNL  Sensory/Perceptual disturbances:     none  Orientation:   x4  Attention:   Good  Concentration:   Good  Memory:   Intact  Fund of knowledge:    Consistent with age and development  Insight:     Good  Judgment:    Good  Impulse Control:   Good     Reported Symptoms:  anxiety, neg self talk, stress-eating, "overly self conscious".  Risk Assessment: Danger to Self:  No Self-injurious Behavior: No Danger to Others: No Duty to Warn:no Physical Aggression / Violence:No  Access to Firearms a concern: No  Gang Involvement:No  Patient / guardian was educated about steps to take if suicide or homicide risk level increases between visits: yes While future psychiatric events cannot be accurately predicted, the patient does not currently require acute inpatient psychiatric care and does not currently meet [facility] involuntary commitment criteria.   Medications: Current Outpatient Medications  Medication Sig Dispense Refill   albuterol (VENTOLIN HFA) 108 (90 Base) MCG/ACT inhaler Inhale 2 puffs into the lungs every 4-6 hours as needed for cough/wheeze 6.7 g 0   albuterol (VENTOLIN HFA) 108 (90 Base) MCG/ACT inhaler Inhale 2 puffs into the lungs every 4-6 hours as needed for cough/wheeze (Patient not taking: Reported on 05/14/2023) 6.7 g 0   beclomethasone (QVAR REDIHALER) 80 MCG/ACT inhaler Inhale 2 puffs into the lungs 2 (two) times daily (Patient not taking: Reported on 05/14/2023) 10.6 g 5   buPROPion (WELLBUTRIN XL) 150 MG 24 hr tablet Take 1 tablet (150 mg total)  by mouth daily. 90 tablet 1   cetirizine (ZYRTEC) 10 MG tablet Take 10 mg by mouth daily.     dicyclomine (BENTYL) 20 MG tablet Take 1/2-1 tablet (10-20 mg total) by mouth 3 (three) times daily as needed for spasms. (Patient not taking: Reported on 05/14/2023) 90 tablet 1   diphenhydrAMINE (BENADRYL) 25 mg capsule Take 25 mg by mouth every 6 (six) hours as needed.     fluticasone (FLONASE) 50 MCG/ACT nasal spray Place 1 spray into both nostrils daily. 16 g 5   fluticasone (FLOVENT HFA) 110 MCG/ACT inhaler Inhale 2 puffs into the lungs 2 (two) times daily. 12 g 1   FLUZONE QUADRIVALENT 0.5 ML injection  (Patient not taking: Reported on 05/14/2023)     rizatriptan (MAXALT) 10 MG tablet Take 1 tablet (10 mg total) by mouth as needed for migraine. May repeat in 2 hours if needed (Patient not taking: Reported on 05/14/2023) 10 tablet 1   vortioxetine HBr (TRINTELLIX) 10 MG TABS tablet Take 1 tablet (10 mg total) by mouth daily. 90 tablet 1   No current facility-administered medications for this visit.   Subjective:  Patient arrived on time for today's session.  Assessed recent events, progress.  Patient shared how he continues to have family stress.  He stated that his family did not celebrate Christmas with presents due to finances.  He stated his parents are both home  and cooking that day which was nice.  He stated they are often in and out of the home, seeing friends which patient does not fully understand as this was not typical behavior over the years.  He stated he is excited about seeing his girlfriend which occurs in March.  He stated that they have been doing well however, recently there was some challenges in the relationship due to her struggling with her self confidence and insecurity.  He stated she sometimes will question him if he is being faithful to her, which he states he is and would never be unfaithful to her.  He stated this was upsetting to him and it has occurred before.  We explored ways  to cope, facilitating his identifying emotional thinking he may have in these moments and discussed countering it with rational, calming self talk.  Provide support and understanding throughout as well as encouraged patient to remind himself of this when these moments occur.  Intervention: supportive therapy, motivational interviewing, CBT   Diagnoses:    ICD-10-CM   1. Mild episode of recurrent major depressive disorder (HCC)  F33.0         Plan: Patient is to use CBT, mindfulness and coping skills to help manage stress, and identify outlets for stress management, relaxation.  Patient to continue to utilize his support system and begin exercise.   Long-term goal:   Reduce overall level, frequency, and intensity of the feelings of emotional distress that inadvertently affects patient's mood, functioning and relationships for at least 3 consecutive months oer patient report.    Short-term goal:  Decrease anxiety producing self talk such as thinking of the worse possible life outcome Increase assertiveness with his communication to family members to elicit more help with day-to-day chores and tasks as needed Improve self confidence   Assessment of progress:  progressing   Waldron Session, [facility]

## 2023-11-14 ENCOUNTER — Ambulatory Visit: Payer: Commercial Managed Care - PPO | Admitting: Psychiatry

## 2023-11-22 ENCOUNTER — Ambulatory Visit (INDEPENDENT_AMBULATORY_CARE_PROVIDER_SITE_OTHER): Payer: Commercial Managed Care - PPO | Admitting: Behavioral Health

## 2023-11-22 ENCOUNTER — Encounter: Payer: Self-pay | Admitting: Behavioral Health

## 2023-11-22 ENCOUNTER — Other Ambulatory Visit (HOSPITAL_COMMUNITY): Payer: Self-pay

## 2023-11-22 DIAGNOSIS — F411 Generalized anxiety disorder: Secondary | ICD-10-CM

## 2023-11-22 DIAGNOSIS — F422 Mixed obsessional thoughts and acts: Secondary | ICD-10-CM

## 2023-11-22 DIAGNOSIS — F330 Major depressive disorder, recurrent, mild: Secondary | ICD-10-CM | POA: Diagnosis not present

## 2023-11-22 MED ORDER — VORTIOXETINE HBR 10 MG PO TABS
10.0000 mg | ORAL_TABLET | Freq: Every day | ORAL | 1 refills | Status: AC
Start: 2023-11-22 — End: ?
  Filled 2023-11-22 – 2024-02-10 (×4): qty 90, 90d supply, fill #0
  Filled 2024-05-02: qty 90, 90d supply, fill #1

## 2023-11-22 MED ORDER — BUPROPION HCL ER (XL) 150 MG PO TB24
150.0000 mg | ORAL_TABLET | Freq: Every day | ORAL | 1 refills | Status: AC
Start: 2023-11-22 — End: ?
  Filled 2023-11-22 – 2024-02-10 (×3): qty 90, 90d supply, fill #0
  Filled 2024-05-02: qty 90, 90d supply, fill #1

## 2023-11-22 NOTE — Progress Notes (Signed)
[facility] Med Check  Patient ID: JAIVION KINGSLEY,  MRN: [id number]  PCP: Sandre Kitty, MD  Date of Evaluation: 11/22/2023 Time spent:30 minutes  Chief Complaint:  Chief Complaint   Anxiety; Depression; Follow-up; Medication Refill; Patient Education; OCD     HISTORY/CURRENT STATUS: HPI  AAIDYN SAN presents to the office today for follow-up of anxiety and depression.    Although he is reporting some situational stressors, he is doing well with depression and anxiety recently. Feels like medications are working well Requesting no changes this visit to meds. He is now dating a girl long distant for over year now. Although he considers himself Bi-Sexual, he feels this term is to limited and say he prefers Pan Sexual.  Currently still unemployed but has some leads for new job. Currently report anxiety at 3/10 and depression at 3/10. He is sleeping 6-7 hours on average per night.  No recent delusions. Denies mania, no psychosis. No auditory or visual hallucinations. Denies SI or HI.     Past Psychiatric Medication Trials: Clomipramine- helpful for OCD. Caused worsening depression. Lexapro Luvox Zoloft Effexor XR Rexulti- Ineffective Saphris Klonopin      Past Psychiatric Medication Trials: Clomipramine- helpful for OCD. Caused worsening depression. Lexapro Luvox Zoloft Effexor XR Rexulti- Ineffective Saphris Klonopin      Individual Medical History/ Review of Systems: Changes? :No   Allergies: Patient has no known allergies.  Current Medications:  Current Outpatient Medications:    albuterol (VENTOLIN HFA) 108 (90 Base) MCG/ACT inhaler, Inhale 2 puffs into the lungs every 4-6 hours as needed for cough/wheeze, Disp: 6.7 g, Rfl: 0   albuterol (VENTOLIN HFA) 108 (90 Base) MCG/ACT inhaler, Inhale 2 puffs into the lungs every 4-6 hours as needed for cough/wheeze (Patient not taking: Reported on 05/14/2023), Disp: 6.7 g, Rfl: 0   beclomethasone (QVAR REDIHALER) 80  MCG/ACT inhaler, Inhale 2 puffs into the lungs 2 (two) times daily (Patient not taking: Reported on 05/14/2023), Disp: 10.6 g, Rfl: 5   buPROPion (WELLBUTRIN XL) 150 MG 24 hr tablet, Take 1 tablet (150 mg total) by mouth daily., Disp: 90 tablet, Rfl: 1   cetirizine (ZYRTEC) 10 MG tablet, Take 10 mg by mouth daily., Disp: , Rfl:    dicyclomine (BENTYL) 20 MG tablet, Take 1/2-1 tablet (10-20 mg total) by mouth 3 (three) times daily as needed for spasms. (Patient not taking: Reported on 05/14/2023), Disp: 90 tablet, Rfl: 1   diphenhydrAMINE (BENADRYL) 25 mg capsule, Take 25 mg by mouth every 6 (six) hours as needed., Disp: , Rfl:    fluticasone (FLONASE) 50 MCG/ACT nasal spray, Place 1 spray into both nostrils daily., Disp: 16 g, Rfl: 5   fluticasone (FLOVENT HFA) 110 MCG/ACT inhaler, Inhale 2 puffs into the lungs 2 (two) times daily., Disp: 12 g, Rfl: 1   FLUZONE QUADRIVALENT 0.5 ML injection, , Disp: , Rfl:    rizatriptan (MAXALT) 10 MG tablet, Take 1 tablet (10 mg total) by mouth as needed for migraine. May repeat in 2 hours if needed (Patient not taking: Reported on 05/14/2023), Disp: 10 tablet, Rfl: 1   vortioxetine HBr (TRINTELLIX) 10 MG TABS tablet, Take 1 tablet (10 mg total) by mouth daily., Disp: 90 tablet, Rfl: 1 Medication Side Effects: none  Family Medical/ Social History: Changes? No  MENTAL HEALTH EXAM:  There were no vitals taken for this visit.There is no height or weight on file to calculate BMI.  General Appearance: Casual, Neat, and Well Groomed  Eye Contact:  Good  Speech:  Clear and Coherent  Volume:  Normal  Mood:  Anxious and Depressed  Affect:  Appropriate, Depressed, and Anxious  Thought Process:  Coherent  Orientation:  Full (Time, Place, and Person)  Thought Content: Logical   Suicidal Thoughts:  No  Homicidal Thoughts:  No  Memory:  WNL  Judgement:  Good  Insight:  Good  Psychomotor Activity:  Normal  Concentration:  Concentration: Good  Recall:  Good  Fund of  Knowledge: Good  Language: Good  Assets:  Desire for Improvement  ADL's:  Intact  Cognition: WNL  Prognosis:  Good    DIAGNOSES:    ICD-10-CM   1. GAD (generalized anxiety disorder)  F41.1     2. Mild episode of recurrent major depressive disorder (HCC)  F33.0 buPROPion (WELLBUTRIN XL) 150 MG 24 hr tablet    vortioxetine HBr (TRINTELLIX) 10 MG TABS tablet    3. Mixed obsessional thoughts and acts  F42.2       Receiving Psychotherapy: Yes    RECOMMENDATIONS:   Greater than 50% of  30 min face to face time with patient was spent on counseling and coordination of care. We discussed his current stability and so far doing well overall with Trintellix. Requesting no medication changes this visit. Will continue in psychotherapy with Elio Forget Continue Wellbutrin XL 150 mg daily for depression.  Continue Trintellix 10 mg daily Pt to follow-up in 3 months or sooner if clinically indicated.  Patient advised to contact office with any questions, adverse effects, or acute worsening in signs and symptoms Provided Emergency Contact information Reviewed PDMP Joan Flores, NP

## 2023-11-27 ENCOUNTER — Ambulatory Visit (INDEPENDENT_AMBULATORY_CARE_PROVIDER_SITE_OTHER): Payer: Commercial Managed Care - PPO | Admitting: Mental Health

## 2023-11-27 DIAGNOSIS — F411 Generalized anxiety disorder: Secondary | ICD-10-CM | POA: Diagnosis not present

## 2023-11-27 NOTE — Progress Notes (Signed)
[facility] Counsel[facility] psychotherapy Note  Name: David Sweeney Date: 11/27/23 MRN: [id number] DOB: Feb 13, 2001 PCP: Sandre Kitty, MD  Time spent:  47 minutes  Treatment:  Individual therapy   Mental Status Exam:    Appearance:    Casual     Behavior:   Appropriate  Motor:   WNL  Speech/Language:    Clear and Coherent  Affect:   Full range   Mood:   depressed  Thought process:   normal  Thought content:     WNL  Sensory/Perceptual disturbances:     none  Orientation:   x4  Attention:   Good  Concentration:   Good  Memory:   Intact  Fund of knowledge:    Consistent with age and development  Insight:     Good  Judgment:    Good  Impulse Control:   Good     Reported Symptoms:  anxiety, neg self talk, stress-eating, "overly self conscious".  Risk Assessment: Danger to Self:  No Self-injurious Behavior: No Danger to Others: No Duty to Warn:no Physical Aggression / Violence:No  Access to Firearms a concern: No  Gang Involvement:No  Patient / guardian was educated about steps to take if suicide or homicide risk level increases between visits: yes While future psychiatric events cannot be accurately predicted, the patient does not currently require acute inpatient psychiatric care and does not currently meet [facility] involuntary commitment criteria.   Medications: Current Outpatient Medications  Medication Sig Dispense Refill   albuterol (VENTOLIN HFA) 108 (90 Base) MCG/ACT inhaler Inhale 2 puffs into the lungs every 4-6 hours as needed for cough/wheeze 6.7 g 0   albuterol (VENTOLIN HFA) 108 (90 Base) MCG/ACT inhaler Inhale 2 puffs into the lungs every 4-6 hours as needed for cough/wheeze (Patient not taking: Reported on 05/14/2023) 6.7 g 0   beclomethasone (QVAR REDIHALER) 80 MCG/ACT inhaler Inhale 2 puffs into the lungs 2 (two) times daily (Patient not taking: Reported on 05/14/2023) 10.6 g 5   buPROPion (WELLBUTRIN XL) 150 MG 24 hr tablet Take 1 tablet (150 mg total)  by mouth daily. 90 tablet 1   cetirizine (ZYRTEC) 10 MG tablet Take 10 mg by mouth daily.     dicyclomine (BENTYL) 20 MG tablet Take 1/2-1 tablet (10-20 mg total) by mouth 3 (three) times daily as needed for spasms. (Patient not taking: Reported on 05/14/2023) 90 tablet 1   diphenhydrAMINE (BENADRYL) 25 mg capsule Take 25 mg by mouth every 6 (six) hours as needed.     fluticasone (FLONASE) 50 MCG/ACT nasal spray Place 1 spray into both nostrils daily. 16 g 5   fluticasone (FLOVENT HFA) 110 MCG/ACT inhaler Inhale 2 puffs into the lungs 2 (two) times daily. 12 g 1   FLUZONE QUADRIVALENT 0.5 ML injection  (Patient not taking: Reported on 05/14/2023)     rizatriptan (MAXALT) 10 MG tablet Take 1 tablet (10 mg total) by mouth as needed for migraine. May repeat in 2 hours if needed (Patient not taking: Reported on 05/14/2023) 10 tablet 1   vortioxetine HBr (TRINTELLIX) 10 MG TABS tablet Take 1 tablet (10 mg total) by mouth daily. 90 tablet 1   No current facility-administered medications for this visit.   Subjective:  Patient arrived on time for today's session.  Assessed recent events, progress.  Patient shared how he continues to have family stress.  He went on to share about 2 weeks ago his father and mother talked with him about getting a job that they  had lined up.  Patient stated he did not have any choice about the matter and that he would have to use a driving service to and from work which will cost him approximately $60 per day.  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  He states that he feels this will also help with his anxiety as it will allow him to be more social with others.  Explored collaboratively ways he feels this could be helpful, interacting with others and associated cognitions, how to be self supportive and frame thoughts in this way.  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  He continues to look forward to seeing his girlfriend in March, continues to save money.   Intervention: supportive therapy, motivational interviewing, CBT   Diagnoses:    ICD-10-CM   1. GAD (generalized anxiety disorder)  F41.1          Plan: Patient is to use CBT, mindfulness and coping skills to help manage stress, and identify outlets for stress management, relaxation.  Patient to continue to utilize his support system and begin exercise.   Long-term goal:   Reduce overall level, frequency, and intensity of the feelings of emotional distress that inadvertently affects patient's mood, functioning and relationships for at least 3 consecutive months oer patient report.    Short-term goal:  Decrease anxiety producing self talk such as thinking of the worse possible life outcome Increase assertiveness with his communication to family members to elicit more help with day-to-day chores and tasks as needed Improve self confidence   Assessment of progress:  progressing   Waldron Session, [facility]

## 2023-11-28 ENCOUNTER — Other Ambulatory Visit (HOSPITAL_COMMUNITY): Payer: Self-pay

## 2023-12-04 ENCOUNTER — Other Ambulatory Visit (HOSPITAL_COMMUNITY): Payer: Self-pay

## 2023-12-17 ENCOUNTER — Other Ambulatory Visit (HOSPITAL_COMMUNITY): Payer: Self-pay

## 2023-12-23 ENCOUNTER — Ambulatory Visit (INDEPENDENT_AMBULATORY_CARE_PROVIDER_SITE_OTHER): Payer: Commercial Managed Care - PPO | Admitting: Mental Health

## 2023-12-23 DIAGNOSIS — F330 Major depressive disorder, recurrent, mild: Secondary | ICD-10-CM

## 2023-12-23 DIAGNOSIS — F411 Generalized anxiety disorder: Secondary | ICD-10-CM | POA: Diagnosis not present

## 2023-12-23 NOTE — Progress Notes (Signed)
 [facility] psychotherapy Note  Name: David Sweeney Date: 12/23/23 MRN: [id number] DOB: 2001-05-27 PCP: Sandre Kitty, MD  Time spent:  48 minutes  Treatment:  Individual therapy   Mental Status Exam:    Appearance:    Casual     Behavior:   Appropriate  Motor:   WNL  Speech/Language:    Clear and Coherent  Affect:   Full range   Mood:   depressed  Thought process:   normal  Thought content:     WNL  Sensory/Perceptual disturbances:     none  Orientation:   x4  Attention:   Good  Concentration:   Good  Memory:   Intact  Fund of knowledge:    Consistent with age and development  Insight:     Good  Judgment:    Good  Impulse Control:   Good     Reported Symptoms:  anxiety, neg self talk, stress-eating, "overly self conscious".  Risk Assessment: Danger to Self:  No Self-injurious Behavior: No Danger to Others: No Duty to Warn:no Physical Aggression / Violence:No  Access to Firearms a concern: No  Gang Involvement:No  Patient / guardian was educated about steps to take if suicide or homicide risk level increases between visits: yes While future psychiatric events cannot be accurately predicted, the patient does not currently require acute inpatient psychiatric care and does not currently meet [facility] involuntary commitment criteria.   Medications: Current Outpatient Medications  Medication Sig Dispense Refill   albuterol (VENTOLIN HFA) 108 (90 Base) MCG/ACT inhaler Inhale 2 puffs into the lungs every 4-6 hours as needed for cough/wheeze 6.7 g 0   albuterol (VENTOLIN HFA) 108 (90 Base) MCG/ACT inhaler Inhale 2 puffs into the lungs every 4-6 hours as needed for cough/wheeze (Patient not taking: Reported on 05/14/2023) 6.7 g 0   beclomethasone (QVAR REDIHALER) 80 MCG/ACT inhaler Inhale 2 puffs into the lungs 2 (two) times daily (Patient not taking: Reported on 05/14/2023) 10.6 g 5   buPROPion (WELLBUTRIN XL) 150 MG 24 hr tablet Take 1 tablet (150 mg total)  by mouth daily. 90 tablet 1   cetirizine (ZYRTEC) 10 MG tablet Take 10 mg by mouth daily.     dicyclomine (BENTYL) 20 MG tablet Take 1/2-1 tablet (10-20 mg total) by mouth 3 (three) times daily as needed for spasms. (Patient not taking: Reported on 05/14/2023) 90 tablet 1   diphenhydrAMINE (BENADRYL) 25 mg capsule Take 25 mg by mouth every 6 (six) hours as needed.     fluticasone (FLONASE) 50 MCG/ACT nasal spray Place 1 spray into both nostrils daily. 16 g 5   fluticasone (FLOVENT HFA) 110 MCG/ACT inhaler Inhale 2 puffs into the lungs 2 (two) times daily. 12 g 1   FLUZONE QUADRIVALENT 0.5 ML injection  (Patient not taking: Reported on 05/14/2023)     rizatriptan (MAXALT) 10 MG tablet Take 1 tablet (10 mg total) by mouth as needed for migraine. May repeat in 2 hours if needed (Patient not taking: Reported on 05/14/2023) 10 tablet 1   vortioxetine HBr (TRINTELLIX) 10 MG TABS tablet Take 1 tablet (10 mg total) by mouth daily. 90 tablet 1   No current facility-administered medications for this visit.   Subjective:  Patient arrived on time for today's session.  Assessed progress where patient shared recent stressors, focusing on the relationship with his girlfriend.  He went on to share how they decided to try making their relationship open, polyamorous, however, he stated this was not successful  and caused them to have their own relationship stress as a couple.  He went on to share details, and through further guided discovery he identified feelings of guilt associated with some of his decisions.  Patient was encouraged to recognize aspects of his making the decisions, working with him to reframe and to decrease distressful feelings.  He went on to share how he felt that he had "backslid" regarding his progress in some aspects related to his work in his therapy.  Provide support and understanding and space for patient to identify, process feelings.  He stated that he and his girlfriend are continuing to work  on their relationship and both are currently committed.  Intervention: supportive therapy, motivational interviewing, CBT   Diagnoses:  No diagnosis found.   Plan: Patient is to use CBT, mindfulness and coping skills to help manage stress, and identify outlets for stress management, relaxation.  Patient to continue to utilize his support system and begin exercise.   Long-term goal:   Reduce overall level, frequency, and intensity of the feelings of emotional distress that inadvertently affects patient's mood, functioning and relationships for at least 3 consecutive months oer patient report.    Short-term goal:  Decrease anxiety producing self talk such as thinking of the worse possible life outcome Increase assertiveness with his communication to family members to elicit more help with day-to-day chores and tasks as needed Improve self confidence   Assessment of progress:  progressing   Waldron Session, [facility]

## 2023-12-24 ENCOUNTER — Other Ambulatory Visit (HOSPITAL_COMMUNITY): Payer: Self-pay

## 2023-12-24 ENCOUNTER — Other Ambulatory Visit: Payer: Self-pay

## 2023-12-27 ENCOUNTER — Other Ambulatory Visit: Payer: Self-pay

## 2024-01-06 ENCOUNTER — Ambulatory Visit (INDEPENDENT_AMBULATORY_CARE_PROVIDER_SITE_OTHER): Payer: Commercial Managed Care - PPO | Admitting: Mental Health

## 2024-01-06 DIAGNOSIS — F330 Major depressive disorder, recurrent, mild: Secondary | ICD-10-CM

## 2024-01-06 DIAGNOSIS — F411 Generalized anxiety disorder: Secondary | ICD-10-CM | POA: Diagnosis not present

## 2024-01-06 NOTE — Progress Notes (Signed)
 [facility] psychotherapy Note  Name: David Sweeney Date:   01/06/24 MRN: [id number] DOB: 2001-06-21 PCP: David Kitty, MD  Time spent:  47 minutes  Treatment:  Individual therapy  Virtual Visit via Telehealth Note Connected with patient by a telemedicine/telehealth application, with their informed consent, and verified patient privacy and that I am speaking with the correct person using two identifiers. I discussed the limitations, risks, security and privacy concerns of performing psychotherapy and the availability of in person appointments. I also discussed with the patient that there may be a patient responsible charge related to this service. The patient expressed understanding and agreed to proceed. I discussed the treatment planning with the patient. The patient was provided an opportunity to ask questions and all were answered. The patient agreed with the plan and demonstrated an understanding of the instructions. The patient was advised to call  our office if  symptoms worsen or feel they are in a crisis state and need immediate contact.   Therapist Location: office Patient Location: home     Mental Status Exam:    Appearance:    Casual     Behavior:   Appropriate  Motor:   WNL  Speech/Language:    Clear and Coherent  Affect:   Full range   Mood:   depressed  Thought process:   normal  Thought content:     WNL  Sensory/Perceptual disturbances:     none  Orientation:   x4  Attention:   Good  Concentration:   Good  Memory:   Intact  Fund of knowledge:    Consistent with age and development  Insight:     Good  Judgment:    Good  Impulse Control:   Good     Reported Symptoms:  anxiety, neg self talk, stress-eating, "overly self conscious".  Risk Assessment: Danger to Self:  No Self-injurious Behavior: No Danger to Others: No Duty to Warn:no Physical Aggression / Violence:No  Access to Firearms a concern: No  Gang Involvement:No  Patient / guardian was  educated about steps to take if suicide or homicide risk level increases between visits: yes While future psychiatric events cannot be accurately predicted, the patient does not currently require acute inpatient psychiatric care and does not currently meet [facility] involuntary commitment criteria.   Medications: Current Outpatient Medications  Medication Sig Dispense Refill   albuterol (VENTOLIN HFA) 108 (90 Base) MCG/ACT inhaler Inhale 2 puffs into the lungs every 4-6 hours as needed for cough/wheeze 6.7 g 0   albuterol (VENTOLIN HFA) 108 (90 Base) MCG/ACT inhaler Inhale 2 puffs into the lungs every 4-6 hours as needed for cough/wheeze (Patient not taking: Reported on 05/14/2023) 6.7 g 0   beclomethasone (QVAR REDIHALER) 80 MCG/ACT inhaler Inhale 2 puffs into the lungs 2 (two) times daily (Patient not taking: Reported on 05/14/2023) 10.6 g 5   buPROPion (WELLBUTRIN XL) 150 MG 24 hr tablet Take 1 tablet (150 mg total) by mouth daily. 90 tablet 1   cetirizine (ZYRTEC) 10 MG tablet Take 10 mg by mouth daily.     dicyclomine (BENTYL) 20 MG tablet Take 1/2-1 tablet (10-20 mg total) by mouth 3 (three) times daily as needed for spasms. (Patient not taking: Reported on 05/14/2023) 90 tablet 1   diphenhydrAMINE (BENADRYL) 25 mg capsule Take 25 mg by mouth every 6 (six) hours as needed.     fluticasone (FLONASE) 50 MCG/ACT nasal spray Place 1 spray into both nostrils daily. 16 g 5  fluticasone (FLOVENT HFA) 110 MCG/ACT inhaler Inhale 2 puffs into the lungs 2 (two) times daily. 12 g 1   FLUZONE QUADRIVALENT 0.5 ML injection  (Patient not taking: Reported on 05/14/2023)     rizatriptan (MAXALT) 10 MG tablet Take 1 tablet (10 mg total) by mouth as needed for migraine. May repeat in 2 hours if needed (Patient not taking: Reported on 05/14/2023) 10 tablet 1   vortioxetine HBr (TRINTELLIX) 10 MG TABS tablet Take 1 tablet (10 mg total) by mouth daily. 90 tablet 1   No current facility-administered medications  for this visit.   Subjective:  Patient engaged in telehealth session via video.  He stated he continues to work his part time job, overall feels it is going well and he is Producer, television/film/video. He looks forward to seeing his gf this week, he has been planning their week together for the last few months.  Parents got into an argument recently, stated that this was difficult as he felt "put in the middle".  He continues to identify feelings of frustration related to his parents relationship.  Through further guided discovery, he recognized the need to continue to maintain awareness of boundaries for himself as he does not want to get engaged in any arguments and some conversations with which she is not comfortable.  He shared how he has followed through on trying to communicate this to his mother.  Reviewed with patient positive changes he has made for himself, maintaining the relationship with his girlfriend was identified, his expressing motivation and excitement to spend time with her, planned activities for their coming trip.  He was able to also recognize his gaining experience at work and being able to maintain his employment.  Intervention: supportive therapy, motivational interviewing, CBT   Diagnoses:    ICD-10-CM   1. GAD (generalized anxiety disorder)  F41.1     2. Mild episode of recurrent major depressive disorder (HCC)  F33.0        Plan: Patient is to use CBT, mindfulness and coping skills to help manage stress, and identify outlets for stress management, relaxation.  Patient to continue to utilize his support system and begin exercise.   Long-term goal:   Reduce overall level, frequency, and intensity of the feelings of emotional distress that inadvertently affects patient's mood, functioning and relationships for at least 3 consecutive months oer patient report.    Short-term goal:  Decrease anxiety producing self talk such as thinking of the worse possible life outcome Increase  assertiveness with his communication to set boundaries as needed in relationships Improve self confidence   Assessment of progress:  progressing   Waldron Session, [facility]

## 2024-01-20 ENCOUNTER — Ambulatory Visit (INDEPENDENT_AMBULATORY_CARE_PROVIDER_SITE_OTHER): Payer: Commercial Managed Care - PPO | Admitting: Mental Health

## 2024-01-20 DIAGNOSIS — F411 Generalized anxiety disorder: Secondary | ICD-10-CM

## 2024-01-20 NOTE — Progress Notes (Signed)
 [facility] psychotherapy Note  Name: David Sweeney Date:   01/20/24 MRN: [id number] DOB: October 14, 2001 PCP: Sandre Kitty, MD  Time spent:  48 minutes  Treatment:  Individual therapy  Virtual Visit via Telehealth Note Connected with patient by a telemedicine/telehealth application, with their informed consent, and verified patient privacy and that I am speaking with the correct person using two identifiers. I discussed the limitations, risks, security and privacy concerns of performing psychotherapy and the availability of in person appointments. I also discussed with the patient that there may be a patient responsible charge related to this service. The patient expressed understanding and agreed to proceed. I discussed the treatment planning with the patient. The patient was provided an opportunity to ask questions and all were answered. The patient agreed with the plan and demonstrated an understanding of the instructions. The patient was advised to call  our office if  symptoms worsen or feel they are in a crisis state and need immediate contact.   Therapist Location: office Patient Location: home     Mental Status Exam:    Appearance:    Casual     Behavior:   Appropriate  Motor:   WNL  Speech/Language:    Clear and Coherent  Affect:   Full range   Mood:   depressed  Thought process:   normal  Thought content:     WNL  Sensory/Perceptual disturbances:     none  Orientation:   x4  Attention:   Good  Concentration:   Good  Memory:   Intact  Fund of knowledge:    Consistent with age and development  Insight:     Good  Judgment:    Good  Impulse Control:   Good     Reported Symptoms:  anxiety, neg self talk, stress-eating, "overly self conscious".  Risk Assessment: Danger to Self:  No Self-injurious Behavior: No Danger to Others: No Duty to Warn:no Physical Aggression / Violence:No  Access to Firearms a concern: No  Gang Involvement:No  Patient / guardian was  educated about steps to take if suicide or homicide risk level increases between visits: yes While future psychiatric events cannot be accurately predicted, the patient does not currently require acute inpatient psychiatric care and does not currently meet [facility] involuntary commitment criteria.   Medications: Current Outpatient Medications  Medication Sig Dispense Refill   albuterol (VENTOLIN HFA) 108 (90 Base) MCG/ACT inhaler Inhale 2 puffs into the lungs every 4-6 hours as needed for cough/wheeze 6.7 g 0   albuterol (VENTOLIN HFA) 108 (90 Base) MCG/ACT inhaler Inhale 2 puffs into the lungs every 4-6 hours as needed for cough/wheeze (Patient not taking: Reported on 05/14/2023) 6.7 g 0   beclomethasone (QVAR REDIHALER) 80 MCG/ACT inhaler Inhale 2 puffs into the lungs 2 (two) times daily (Patient not taking: Reported on 05/14/2023) 10.6 g 5   buPROPion (WELLBUTRIN XL) 150 MG 24 hr tablet Take 1 tablet (150 mg total) by mouth daily. 90 tablet 1   cetirizine (ZYRTEC) 10 MG tablet Take 10 mg by mouth daily.     dicyclomine (BENTYL) 20 MG tablet Take 1/2-1 tablet (10-20 mg total) by mouth 3 (three) times daily as needed for spasms. (Patient not taking: Reported on 05/14/2023) 90 tablet 1   diphenhydrAMINE (BENADRYL) 25 mg capsule Take 25 mg by mouth every 6 (six) hours as needed.     fluticasone (FLONASE) 50 MCG/ACT nasal spray Place 1 spray into both nostrils daily. 16 g 5  fluticasone (FLOVENT HFA) 110 MCG/ACT inhaler Inhale 2 puffs into the lungs 2 (two) times daily. 12 g 1   FLUZONE QUADRIVALENT 0.5 ML injection  (Patient not taking: Reported on 05/14/2023)     rizatriptan (MAXALT) 10 MG tablet Take 1 tablet (10 mg total) by mouth as needed for migraine. May repeat in 2 hours if needed (Patient not taking: Reported on 05/14/2023) 10 tablet 1   vortioxetine HBr (TRINTELLIX) 10 MG TABS tablet Take 1 tablet (10 mg total) by mouth daily. 90 tablet 1   No current facility-administered medications  for this visit.   Subjective:  Patient engaged in telehealth session via audio. He was able to see his girlfriend as discussed last for about 1 week  as discussed last sess  He stated that the trip went well, they had many pleasurable experiences together, gotten along other even more.  He stated that he feels the strengths in their relationship. They continue to make plans to live with one another at some point. He continues to work his part time job, would like get more hours per week. He worries that he will not be able get a permanent position at this point.  He stated he was upset after work recently, his parents were supposed to pick him up but they changed plans last minute; he stated this occurs often, gave examples. He has not shared his feelings with them often, he does not feel hurt when he is attempted in the past.  He shared more family dynamics, his continuing to need at some point be able to move around after stating that he has he continues to want to live with his girlfriend.  Patient was encouraged to recognize his being resourceful, thinking employment working through anxiety that can increase in social situations.  Intervention: supportive therapy, motivational interviewing, CBT   Diagnoses:    ICD-10-CM   1. GAD (generalized anxiety disorder)  F41.1        Plan: Patient is to use CBT, mindfulness and coping skills to help manage stress, and identify outlets for stress management, relaxation.  Patient to continue to utilize his support system and begin exercise.   Long-term goal:   Reduce overall level, frequency, and intensity of the feelings of emotional distress that inadvertently affects patient's mood, functioning and relationships for at least 3 consecutive months oer patient report.    Short-term goal:  Decrease anxiety producing self talk such as thinking of the worse possible life outcome Increase assertiveness with his communication to family members to elicit  more help with day-to-day chores and tasks as needed Improve self confidence   Assessment of progress:  progressing   Waldron Session, [facility]

## 2024-02-03 ENCOUNTER — Ambulatory Visit (INDEPENDENT_AMBULATORY_CARE_PROVIDER_SITE_OTHER): Admitting: Mental Health

## 2024-02-03 DIAGNOSIS — F411 Generalized anxiety disorder: Secondary | ICD-10-CM
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
## 2024-02-10 ENCOUNTER — Other Ambulatory Visit (HOSPITAL_COMMUNITY): Payer: Self-pay

## 2024-02-10 ENCOUNTER — Other Ambulatory Visit: Payer: Self-pay

## 2024-02-27 ENCOUNTER — Ambulatory Visit (INDEPENDENT_AMBULATORY_CARE_PROVIDER_SITE_OTHER): Admitting: Mental Health

## 2024-02-27 DIAGNOSIS — F411 Generalized anxiety disorder: Secondary | ICD-10-CM | POA: Diagnosis not present

## 2024-02-27 NOTE — Progress Notes (Signed)
 [facility] psychotherapy Note  Name: David Sweeney Date:   02/27/24 MRN: [id number] DOB: [birth year] PCP: Laneta Pintos, MD  Time spent:  48 minutes  Treatment:  Individual therapy    Mental Status Exam:    Appearance:    Casual     Behavior:   Appropriate  Motor:   WNL  Speech/Language:    Clear and Coherent  Affect:   Full range   Mood:   Anxious, pleasant  Thought process:   normal  Thought content:     WNL  Sensory/Perceptual disturbances:     none  Orientation:   x4  Attention:   Good  Concentration:   Good  Memory:   Intact  Fund of knowledge:    Consistent with age and development  Insight:     Good  Judgment:    Good  Impulse Control:   Good     Reported Symptoms:  anxiety, neg self talk, stress-eating, "overly self conscious".  Risk Assessment: Danger to Self:  No Self-injurious Behavior: No Danger to Others: No Duty to Warn:no Physical Aggression / Violence:No  Access to Firearms a concern: No  Gang Involvement:No  Patient / guardian was educated about steps to take if suicide or homicide risk level increases between visits: yes While future psychiatric events cannot be accurately predicted, the patient does not currently require acute inpatient psychiatric care and does not currently meet [facility]  involuntary commitment criteria.   Medications: Current Outpatient Medications  Medication Sig Dispense Refill   albuterol  (VENTOLIN  HFA) 108 (90 Base) MCG/ACT inhaler Inhale 2 puffs into the lungs every 4-6 hours as needed for cough/wheeze 6.7 g 0   albuterol  (VENTOLIN  HFA) 108 (90 Base) MCG/ACT inhaler Inhale 2 puffs into the lungs every 4-6 hours as needed for cough/wheeze (Patient not taking: Reported on 05/14/2023) 6.7 g 0   beclomethasone (QVAR  REDIHALER) 80 MCG/ACT inhaler Inhale 2 puffs into the lungs 2 (two) times daily (Patient not taking: Reported on 05/14/2023) 10.6 g 5   buPROPion  (WELLBUTRIN  XL) 150 MG 24 hr tablet Take 1 tablet (150  mg total) by mouth daily. 90 tablet 1   cetirizine (ZYRTEC) 10 MG tablet Take 10 mg by mouth daily.     dicyclomine  (BENTYL ) 20 MG tablet Take 1/2-1 tablet (10-20 mg total) by mouth 3 (three) times daily as needed for spasms. (Patient not taking: Reported on 05/14/2023) 90 tablet 1   diphenhydrAMINE (BENADRYL) 25 mg capsule Take 25 mg by mouth every 6 (six) hours as needed.     fluticasone  (FLONASE ) 50 MCG/ACT nasal spray Place 1 spray into both nostrils daily. 16 g 5   fluticasone  (FLOVENT  HFA) 110 MCG/ACT inhaler Inhale 2 puffs into the lungs 2 (two) times daily. 12 g 1   FLUZONE QUADRIVALENT 0.5 ML injection  (Patient not taking: Reported on 05/14/2023)     rizatriptan  (MAXALT ) 10 MG tablet Take 1 tablet (10 mg total) by mouth as needed for migraine. May repeat in 2 hours if needed (Patient not taking: Reported on 05/14/2023) 10 tablet 1   vortioxetine  HBr (TRINTELLIX ) 10 MG TABS tablet Take 1 tablet (10 mg total) by mouth daily. 90 tablet 1   No current facility-administered medications for this visit.   Subjective:  Assessed progress where patient shared recent changes.  He stated that he and his girlfriend have decided to live together.  He stated there were current plan is for him to move to her town which is next week.  He  stated that he plans to live with her and her parents.  He stated he has secured a full-time job and is looking to secure a part-time job as they plan to eventually get an apartment in about 1 to 2 months.  He stated that it is been somewhat stressful preparing for this move but is also excited.  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  He shared how his parents were supportive overall but also concerned due to the distance as he will be  moving to [place] , which is about 13 hours by car. Patient was encouraged to recognize his identifying and following through with what he wants and needs which he continued to state that he is committed to their relationship.  Invited patient to contact our office for any future sessions if he is to return to this area if needed.  Also discussed his option to continue therapy in that area once he is settled.  Intervention: supportive therapy, motivational interviewing, problem solving   Diagnoses:    ICD-10-CM   1. GAD (generalized anxiety disorder)  F41.1        Plan: Patient to contact our office for future sessions if needed.     Avram Lenis, [facility]

## 2024-03-10 ENCOUNTER — Ambulatory Visit: Admitting: Mental Health

## 2024-03-25 ENCOUNTER — Ambulatory Visit: Admitting: Mental Health

## 2024-05-21 ENCOUNTER — Ambulatory Visit: Payer: Commercial Managed Care - PPO | Admitting: Behavioral Health

## 2024-05-26 ENCOUNTER — Other Ambulatory Visit (HOSPITAL_COMMUNITY): Payer: Self-pay

## 2024-06-01 ENCOUNTER — Other Ambulatory Visit (HOSPITAL_COMMUNITY): Payer: Self-pay

## 2024-06-04 DIAGNOSIS — F419 Anxiety disorder, unspecified: Secondary | ICD-10-CM | POA: Diagnosis not present

## 2024-06-10 DIAGNOSIS — F419 Anxiety disorder, unspecified: Secondary | ICD-10-CM | POA: Diagnosis not present

## 2024-06-11 ENCOUNTER — Other Ambulatory Visit (HOSPITAL_COMMUNITY): Payer: Self-pay

## 2024-06-18 DIAGNOSIS — F419 Anxiety disorder, unspecified: Secondary | ICD-10-CM | POA: Diagnosis not present

## 2024-06-25 DIAGNOSIS — F419 Anxiety disorder, unspecified: Secondary | ICD-10-CM | POA: Diagnosis not present

## 2024-07-09 DIAGNOSIS — F419 Anxiety disorder, unspecified: Secondary | ICD-10-CM | POA: Diagnosis not present

## 2024-07-10 ENCOUNTER — Other Ambulatory Visit (HOSPITAL_COMMUNITY): Payer: Self-pay

## 2024-07-16 DIAGNOSIS — F419 Anxiety disorder, unspecified: Secondary | ICD-10-CM | POA: Diagnosis not present

## 2024-07-21 ENCOUNTER — Other Ambulatory Visit (HOSPITAL_COMMUNITY): Payer: Self-pay

## 2024-07-30 DIAGNOSIS — F419 Anxiety disorder, unspecified: Secondary | ICD-10-CM | POA: Diagnosis not present

## 2024-08-06 ENCOUNTER — Other Ambulatory Visit (HOSPITAL_COMMUNITY): Payer: Self-pay

## 2024-08-06 DIAGNOSIS — F419 Anxiety disorder, unspecified: Secondary | ICD-10-CM | POA: Diagnosis not present

## 2024-08-19 DIAGNOSIS — E663 Overweight: Secondary | ICD-10-CM | POA: Diagnosis not present

## 2024-08-19 DIAGNOSIS — J069 Acute upper respiratory infection, unspecified: Secondary | ICD-10-CM | POA: Diagnosis not present

## 2024-08-21 DIAGNOSIS — F419 Anxiety disorder, unspecified: Secondary | ICD-10-CM | POA: Diagnosis not present

## 2024-08-27 DIAGNOSIS — F419 Anxiety disorder, unspecified: Secondary | ICD-10-CM | POA: Diagnosis not present

## 2024-09-10 DIAGNOSIS — F419 Anxiety disorder, unspecified: Secondary | ICD-10-CM | POA: Diagnosis not present

## 2024-09-17 DIAGNOSIS — F419 Anxiety disorder, unspecified: Secondary | ICD-10-CM | POA: Diagnosis not present

## 2024-09-25 DIAGNOSIS — F419 Anxiety disorder, unspecified: Secondary | ICD-10-CM | POA: Diagnosis not present

## 2024-10-08 DIAGNOSIS — F419 Anxiety disorder, unspecified: Secondary | ICD-10-CM | POA: Diagnosis not present
